# Patient Record
Sex: Male | Born: 1995 | Race: White | Hispanic: Yes | Marital: Single | Smoking: Never smoker
Health system: Southern US, Community
[De-identification: ages and names within clinical notes are randomized; demographics above are authoritative.]

## PROBLEM LIST (undated history)

## (undated) DIAGNOSIS — Z8709 Personal history of other diseases of the respiratory system: Secondary | ICD-10-CM

## (undated) DIAGNOSIS — H0012 Chalazion right lower eyelid: Secondary | ICD-10-CM

## (undated) HISTORY — PX: INGUINAL HERNIA REPAIR: SHX194

---
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
## 2017-04-14 ENCOUNTER — Encounter (HOSPITAL_COMMUNITY): Payer: Self-pay

## 2017-04-14 ENCOUNTER — Emergency Department (HOSPITAL_COMMUNITY): Payer: Medicaid Other

## 2017-04-14 DIAGNOSIS — W228XXA Striking against or struck by other objects, initial encounter: Secondary | ICD-10-CM | POA: Insufficient documentation

## 2017-04-14 DIAGNOSIS — S9002XA Contusion of left ankle, initial encounter: Secondary | ICD-10-CM | POA: Insufficient documentation

## 2017-04-14 DIAGNOSIS — Y939 Activity, unspecified: Secondary | ICD-10-CM | POA: Insufficient documentation

## 2017-04-14 DIAGNOSIS — Y929 Unspecified place or not applicable: Secondary | ICD-10-CM | POA: Diagnosis not present

## 2017-04-14 DIAGNOSIS — S99912A Unspecified injury of left ankle, initial encounter: Secondary | ICD-10-CM | POA: Diagnosis present

## 2017-04-14 DIAGNOSIS — Y990 Civilian activity done for income or pay: Secondary | ICD-10-CM | POA: Insufficient documentation

## 2017-04-14 NOTE — ED Triage Notes (Signed)
Pt states while at work today he hit his L ankle on the corner of something and it still hurts. Pt is ambulatory

## 2017-04-15 ENCOUNTER — Emergency Department (HOSPITAL_COMMUNITY)
Admission: EM | Admit: 2017-04-15 | Discharge: 2017-04-15 | Disposition: A | Discharge status: Home / Self Care | Payer: Medicaid Other | Attending: Emergency Medicine | Admitting: Emergency Medicine

## 2017-04-15 DIAGNOSIS — S9002XA Contusion of left ankle, initial encounter: Secondary | ICD-10-CM

## 2017-04-15 MED ORDER — IBUPROFEN 600 MG PO TABS: 600.0000 mg | ORAL_TABLET | Freq: Four times a day (QID) | ORAL | 0 refills | Status: DC | PRN

## 2017-04-15 NOTE — ED Notes (Signed)
Pt ambulatory to room and in NAD.

## 2017-04-15 NOTE — ED Provider Notes (Signed)
[facility]   CSN: [id number] Arrival date & time: 04/14/17  2138     History   Chief Complaint Chief Complaint  Patient presents with  . Ankle Pain    HPI Derrick Pham is a 21 y.o. male.  21 year old male presents to the emergency department for left ankle pain.  He states that he bumped into something sharp at work and he has been having pain in his left ankle since this time.  He took 2 tablets of Advil prior to arrival without relief.  Pain is aggravated with weightbearing.  He denies any prior ankle injury.  No numbness or tingling.      Past Medical History:  Diagnosis Date  . Chalazion of right lower eyelid 03/2015  . History of asthma    as a child    There are no active problems to display for this patient.   Past Surgical History:  Procedure Laterality Date  . CHALAZION EXCISION Right 04/28/2015   Procedure: EXCISION CHALAZION RIGHT LOWER LID;  Surgeon: French AnaMartha Patel, MD;  Location: [facility];  Service: Ophthalmology;  Laterality: Right;  . INGUINAL HERNIA REPAIR         Home Medications    Prior to Admission medications   Medication Sig Start Date End Date Taking? Authorizing Provider  ibuprofen (ADVIL,MOTRIN) 600 MG tablet Take 1 tablet (600 mg total) by mouth every 6 (six) hours as needed. 04/15/17   Antony MaduraHumes, Kellina Dreese, PA-C  neomycin-polymyxin-dexameth (MAXITROL) 0.1 % OINT Place 1 application into the right eye 3 (three) times daily. For one week 04/28/15   French AnaPatel, Martha, MD    Family History No family history on file.  Social History Social History   Tobacco Use  . Smoking status: Never Smoker  . Smokeless tobacco: Never Used  Substance Use Topics  . Alcohol use: No  . Drug use: No     Allergies   Patient has no known allergies.   Review of Systems Review of Systems Ten systems reviewed and are negative for acute change, except as noted in the HPI.     Physical Exam Updated Vital Signs BP 120/69 (BP Location: Right Arm)   Pulse 95   Temp 100 F (37.8 C) (Oral)   Resp 16   Ht 6' (1.829 m)   Wt 72.1 kg (159 lb)   SpO2 99%   BMI 21.56 kg/m   Physical Exam  Constitutional: He is oriented to person, place, and time. He appears well-developed and well-nourished. No distress.  HENT:  Head: Normocephalic and atraumatic.  Eyes: Conjunctivae and EOM are normal. No scleral icterus.  Neck: Normal range of motion.  Cardiovascular: Normal rate, regular rhythm and intact distal pulses.  DP pulse 2+ in the left lower extremity.  Capillary refill brisk in all digits of left foot  Pulmonary/Chest: Effort normal. No respiratory distress.  Musculoskeletal: Normal range of motion.  Tenderness to palpation to the medial malleolus of the left ankle.  No bony deformity, crepitus.  Minimal swelling.  No effusion, erythema.  Neurological: He is alert and oriented to person, place, and time. He exhibits normal muscle tone. Coordination normal.  Sensation to light touch intact.  Patient able to wiggle all toes.  Skin: Skin is warm and dry. No rash noted. He is not diaphoretic. No erythema. No pallor.  Psychiatric: He has a normal mood and affect. His behavior is normal.  Nursing note and vitals reviewed.    ED  Treatments / Results  Labs (all labs ordered are listed, but only abnormal results are displayed) Labs Reviewed - No data to display  EKG  EKG Interpretation None       Radiology Dg Ankle Complete Left  Result Date: 04/14/2017 CLINICAL DATA:  Hit left ankle on piece of metal EXAM: LEFT ANKLE COMPLETE - 3+ VIEW COMPARISON:  None. FINDINGS: Frontal, oblique, and lateral views were obtained. There is no fracture or joint effusion. No joint space narrowing or erosion. Ankle mortise appears intact. No soft tissue air or radiopaque foreign body. IMPRESSION: No fracture or evident arthropathy.  Ankle mortise appears intact. Electronically  Signed   By: Bretta BangWilliam  Woodruff III M.D.   On: 04/14/2017 22:30    Procedures Procedures (including critical care time)  Medications Ordered in ED Medications - No data to display   Initial Impression / Assessment and Plan / ED Course  I have reviewed the triage vital signs and the nursing notes.  Pertinent labs & imaging results that were available during my care of the patient were reviewed by me and considered in my medical decision making (see chart for details).     Patient presents to the emergency department for evaluation of ankle pain. Patient neurovascularly intact on exam. Imaging negative for fracture, dislocation, bony deformity. Plan for supportive management including RICE and NSAIDs; primary care follow up as needed. Return precautions discussed and provided. Patient discharged in stable condition with no unaddressed concerns.   Final Clinical Impressions(s) / ED Diagnoses   Final diagnoses:  Contusion of left ankle, initial encounter    ED Discharge Orders        Ordered    ibuprofen (ADVIL,MOTRIN) 600 MG tablet  Every 6 hours PRN     04/15/17 0032       Antony MaduraHumes, Kellie Murrill, PA-C 04/15/17 0036    Wilkie AyeHorton, Mayer Maskerourtney F, MD 04/15/17 [phone]

## 2017-04-15 NOTE — ED Notes (Signed)
Pt departed in NAD, refused use of wheelchair.  

## 2019-04-05 IMAGING — CR DG ANKLE COMPLETE 3+V*L*
3 series · 3 of 3 positions shown · non-contrast
Comparison: None.

CLINICAL DATA: Hit left ankle on piece of metal

EXAM:
LEFT ANKLE COMPLETE - 3+ VIEW

[ankle ap]
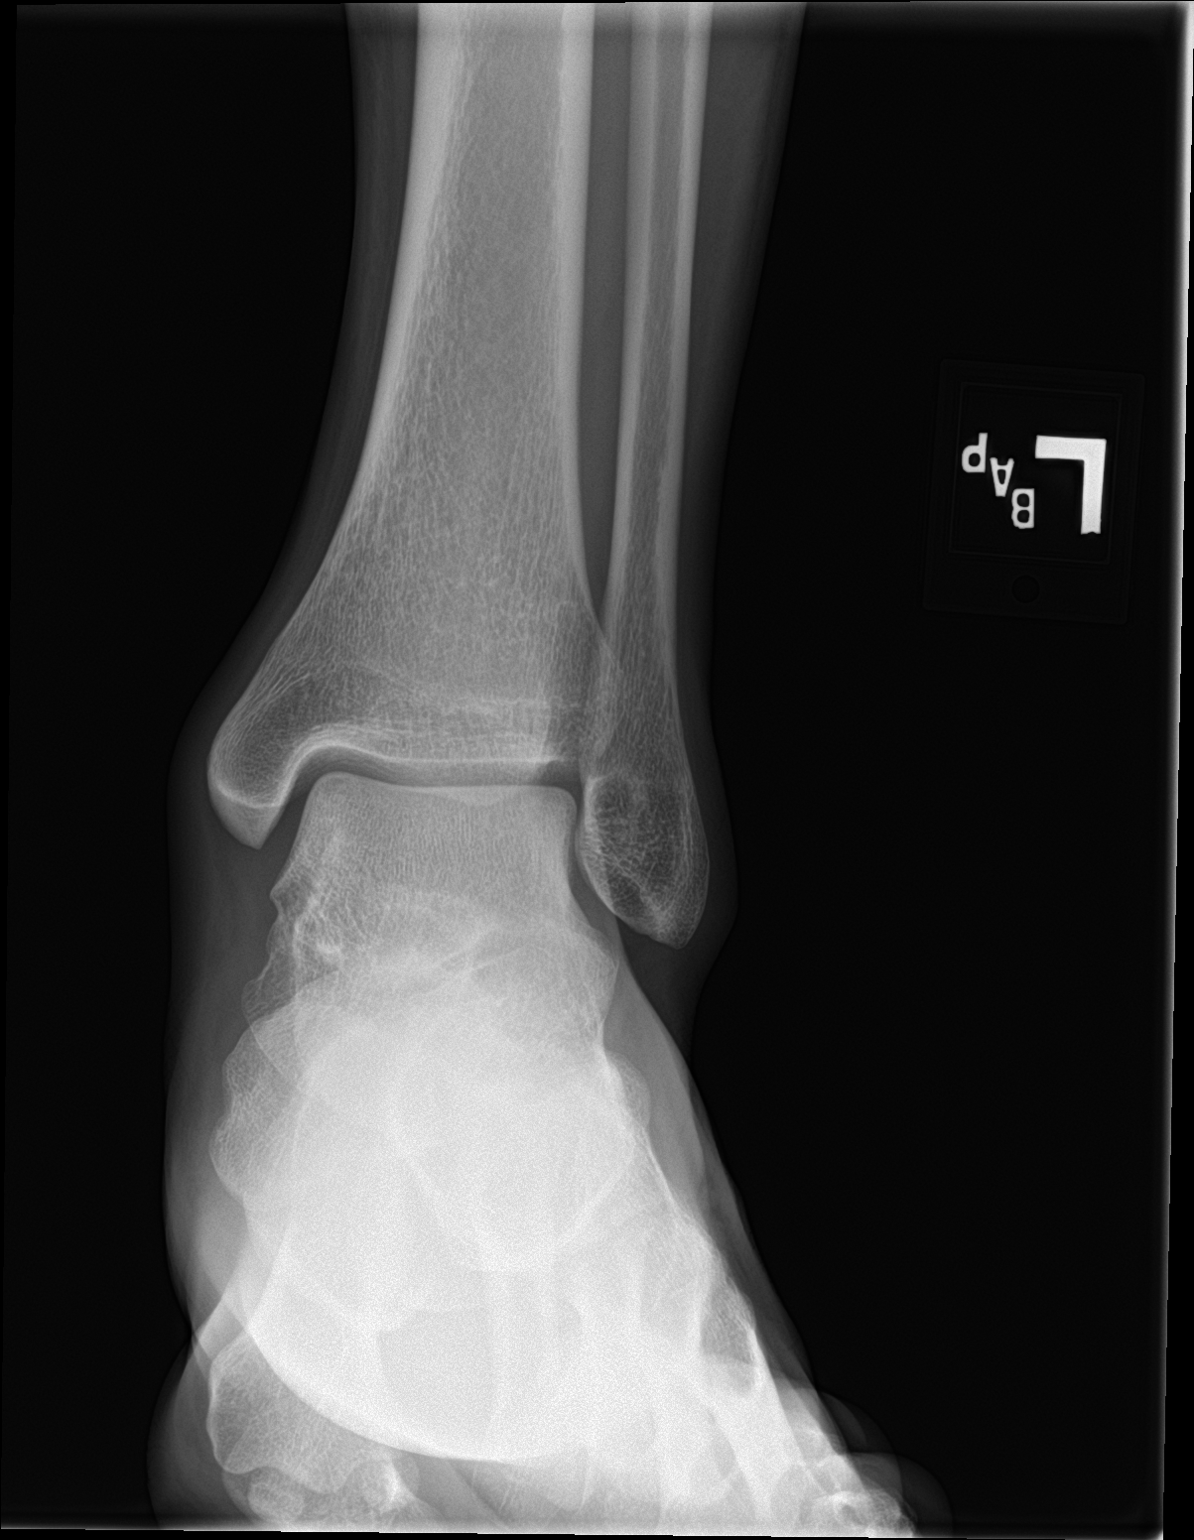

[ankle obl]
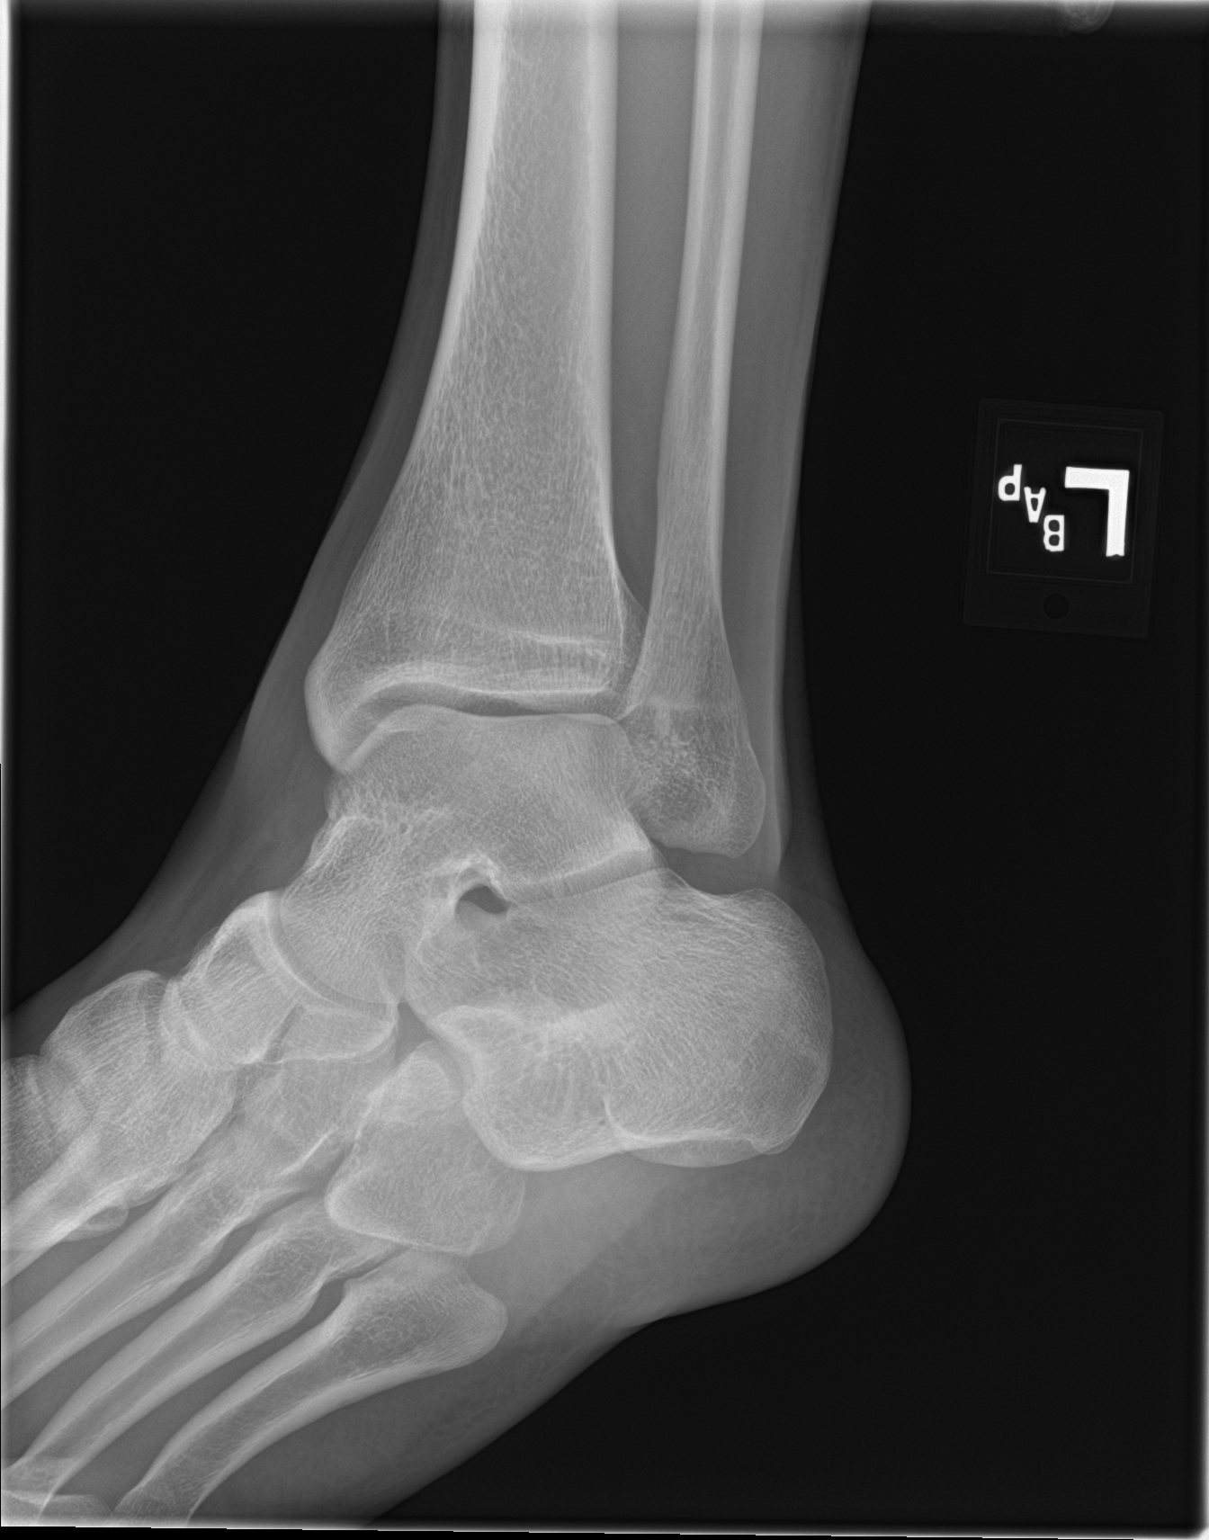

[ankle lat]
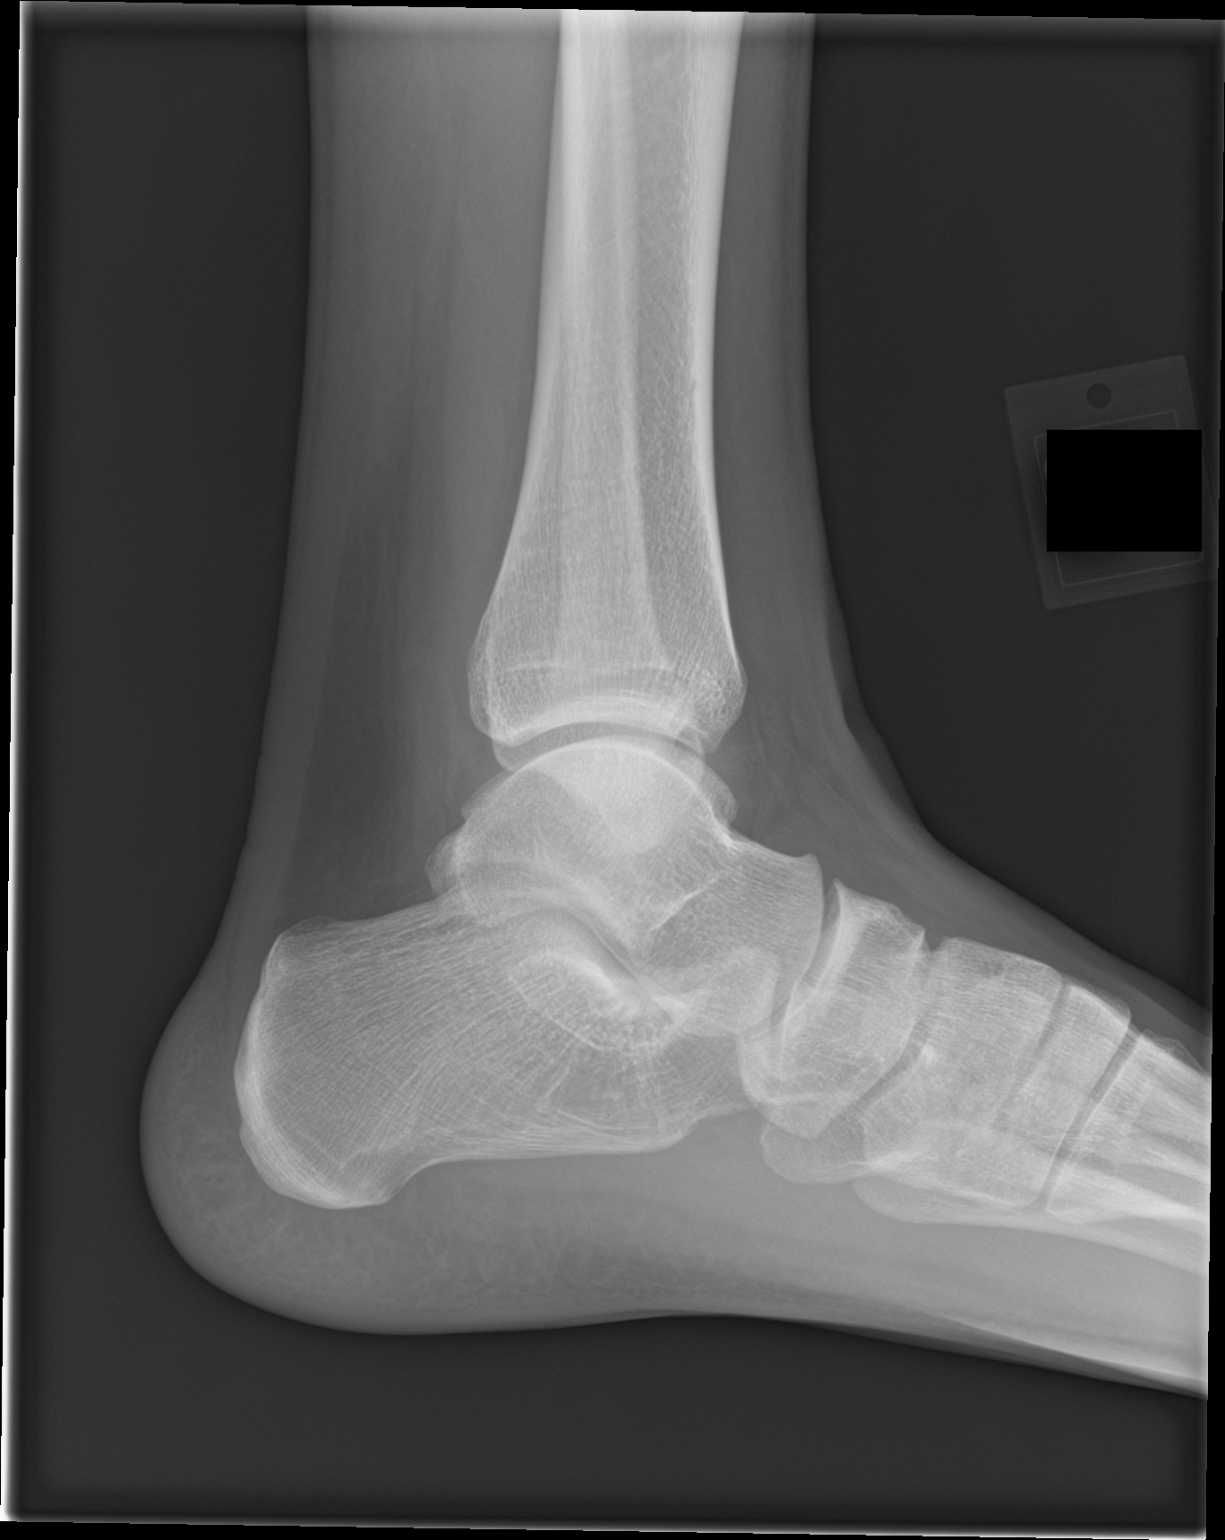

[3 of 3 positions shown; findings below may reference images not displayed]

FINDINGS: Frontal, oblique, and lateral views were obtained. There is no
fracture or joint effusion. No joint space narrowing or erosion.
Ankle mortise appears intact. No soft tissue air or radiopaque
foreign body.
IMPRESSION: No fracture or evident arthropathy.  Ankle mortise appears intact.

## 2023-09-29 ENCOUNTER — Ambulatory Visit (INDEPENDENT_AMBULATORY_CARE_PROVIDER_SITE_OTHER)

## 2023-09-29 ENCOUNTER — Ambulatory Visit: Payer: Self-pay | Admitting: Urgent Care

## 2023-09-29 ENCOUNTER — Ambulatory Visit
Admission: EM | Admit: 2023-09-29 | Discharge: 2023-09-29 | Disposition: A | Discharge status: Home / Self Care | Attending: Family Medicine | Admitting: Family Medicine

## 2023-09-29 ENCOUNTER — Ambulatory Visit
Admission: EM | Admit: 2023-09-29 | Discharge: 2023-09-29 | Disposition: A | Discharge status: Home / Self Care | Attending: Urgent Care | Admitting: Urgent Care

## 2023-09-29 DIAGNOSIS — M25552 Pain in left hip: Secondary | ICD-10-CM | POA: Diagnosis not present

## 2023-09-29 DIAGNOSIS — S5002XA Contusion of left elbow, initial encounter: Secondary | ICD-10-CM | POA: Diagnosis not present

## 2023-09-29 DIAGNOSIS — M25522 Pain in left elbow: Secondary | ICD-10-CM

## 2023-09-29 DIAGNOSIS — S42402A Unspecified fracture of lower end of left humerus, initial encounter for closed fracture: Secondary | ICD-10-CM

## 2023-09-29 DIAGNOSIS — S7002XA Contusion of left hip, initial encounter: Secondary | ICD-10-CM

## 2023-09-29 MED ORDER — NAPROXEN 500 MG PO TABS: 500.0000 mg | ORAL_TABLET | Freq: Two times a day (BID) | ORAL | 0 refills | Status: AC

## 2023-09-29 NOTE — Discharge Instructions (Addendum)
 Your x-ray suggests an occult fracture of the left elbow.  This is a kind of fracture that cannot be seen on x-ray.  He will likely need a CT scan.  For now I have splinted your left elbow.  Please keep the splint on at all times.  Use the sling to help you bear the weight of the splint if you need it.  But otherwise make sure that you continue using your left hand and shoulder.  Still use naproxen for pain and inflammation.  Follow-up with [facility] as soon as possible.

## 2023-09-29 NOTE — ED Triage Notes (Addendum)
 Pt states he fell this am-pain to left hip and left elbow-NAD-steady gait

## 2023-09-29 NOTE — ED Provider Notes (Addendum)
 [facility]  Note:  This document was prepared using Conservation officer, historic buildings and may include unintentional dictation errors.  MRN: [id number] DOB: March 27, 1996  Subjective:   Derrick Pham is a 28 y.o. male presenting for left elbow and left hip pain following an accidental fall this morning.  Patient reports that he was pulling a piece of furniture outside when he slipped and fell onto pavement about 4 feet high.  He observed the impact on his left elbow and left hip/buttock area.  Would like to make sure he does not have a fracture.  No open wounds, drainage of pus or bleeding, bony deformities, head injury, loss conscious, confusion, weakness, numbness or tingling.  Has full range of motion at the left hip but not the left elbow.  Cannot bend or extend fully.  He does note some swelling.  Has not taken medication for relief.  No current facility-administered medications for this encounter.  Current Outpatient Medications:    ibuprofen  (ADVIL ,MOTRIN ) 600 MG tablet, Take 1 tablet (600 mg total) by mouth every 6 (six) hours as needed., Disp: 30 tablet, Rfl: 0   neomycin -polymyxin-dexameth (MAXITROL ) 0.1 % OINT, Place 1 application into the right eye 3 (three) times daily. For one week, Disp: , Rfl:    No Known Allergies  Past Medical History:  Diagnosis Date   Chalazion of right lower eyelid 03/2015   History of asthma    as a child     Past Surgical History:  Procedure Laterality Date   CHALAZION EXCISION Right 04/28/2015   Procedure: EXCISION CHALAZION RIGHT LOWER LID;  Surgeon: Ozella Blush, MD;  Location: [facility];  Service: Ophthalmology;  Laterality: Right;   INGUINAL HERNIA REPAIR      No family history on file.  Social History   Tobacco Use   Smoking status: Never   Smokeless tobacco: Never  Vaping Use   Vaping status: Never Used  Substance Use Topics   Alcohol use: Yes    Comment: weekly   Drug use: No     ROS   Objective:   Vitals: BP (!) 114/59 (BP Location: Right Arm)   Pulse 65   Temp 98.9 F (37.2 C) (Oral)   Resp 16   SpO2 97%   Physical Exam Constitutional:      General: He is not in acute distress.    Appearance: Normal appearance. He is well-developed and normal weight. He is not ill-appearing, toxic-appearing or diaphoretic.  HENT:     Head: Normocephalic and atraumatic.     Right Ear: External ear normal.     Left Ear: External ear normal.     Nose: Nose normal.     Mouth/Throat:     Pharynx: Oropharynx is clear.  Eyes:     General: No scleral icterus.       Right eye: No discharge.        Left eye: No discharge.     Extraocular Movements: Extraocular movements intact.  Cardiovascular:     Rate and Rhythm: Normal rate.  Pulmonary:     Effort: Pulmonary effort is normal.  Musculoskeletal:     Left upper arm: No swelling, edema, deformity, lacerations, tenderness or bony tenderness.     Left elbow: Swelling present. No deformity, effusion or lacerations. Decreased range of motion. Tenderness present in olecranon process. No radial head, medial epicondyle or lateral epicondyle tenderness.     Left forearm: No swelling, edema, deformity, lacerations, tenderness or  bony tenderness.     Cervical back: Normal range of motion.     Left hip: Tenderness (lateral hip) present. No deformity, lacerations, bony tenderness or crepitus. Normal range of motion.  Neurological:     Mental Status: He is alert and oriented to person, place, and time.  Psychiatric:        Mood and Affect: Mood normal.        Behavior: Behavior normal.        Thought Content: Thought content normal.        Judgment: Judgment normal.     Assessment and Plan :   PDMP not reviewed this encounter.  1. Contusion of left elbow, initial encounter   2. Left elbow pain   3. Left hip pain   4. Contusion of left hip, initial encounter    X-ray over-read was pending at time of discharge,  recommended follow up with only abnormal results. Otherwise will not call for negative over-read. Patient was in agreement.  Recommended conservative management for left elbow and hip contusions.  Use icing, naproxen for pain and inflammation. Counseled patient on potential for adverse effects with medications prescribed/recommended today, ER and return-to-clinic precautions discussed, patient verbalized understanding.   Adolph Hoop, [place] 09/29/23 1537   UPDATE: DG Elbow Complete Left Result Date: 09/29/2023 CLINICAL DATA:  left elbow pain EXAM: LEFT ELBOW - COMPLETE 3+ VIEW COMPARISON:  None Available. FINDINGS: No acute, displaced fracture or dislocation. Elevation of the anterior fat pad, suggesting the presence of an underlying joint effusion. There is no evidence of arthropathy or other focal bone abnormality. Soft tissues are unremarkable. IMPRESSION: Elevated anterior fat pad suggests the presence of an elbow joint effusion. While no acute, displaced fracture is visualized, in the setting of trauma, elbow effusions are consistent with occult elbow fracture. Electronically Signed   By: Rance Burrows M.D.   On: 09/29/2023 16:14   DG Hip Unilat With Pelvis 2-3 Views Left Result Date: 09/29/2023 CLINICAL DATA:  Left hip pain EXAM: DG HIP (WITH OR WITHOUT PELVIS) 2-3V LEFT COMPARISON:  None Available. FINDINGS: There is no evidence of hip fracture or dislocation. There is no evidence of arthropathy or other focal bone abnormality. IMPRESSION: Negative. Electronically Signed   By: Tyron Gallon M.D.   On: 09/29/2023 16:06    Patient placed into a long-arm splint with the elbow flexed at 90 degrees.  Provided with a shoulder sling for bearing the weight.  1. Left elbow fracture, closed, initial encounter   2. Left elbow pain   3. Left hip pain   4. Contusion of left elbow, initial encounter   5. Contusion of left hip, initial encounter    Splinted as above, follow-up with [facility].     Adolph Hoop, [place] 09/29/23 1914

## 2023-11-05 NOTE — Unmapped External Note (Signed)
 General Outpatient Physical Therapy Evaluation  Payor: Advertising copywriter / Plan: [employer] PPO EPO CHOICE [employer] / Product Type: PPO /   Visit Count: 1  Demographics:  Age: 28 y.o.  Gender: male  Referring Diagnosis: Closed nondisplaced fracture of head of left radius, initial encounter  Referring Clinician:  Danton Lauraine Hudson, *    Date of Onset: 09/29/2023  History of Present Illness: While at work performing his job duties, patient reported falling off a ladder with injury to his left elbow, left wrist, left lateral hip, left ankle, sternum, and left clavicle.  Patient was initially seen at urgent care then referred to [facility] orthopedic trauma specialist for evaluation and treatment.  Patient reported that his left elbow complaint has been gradually improving but he continues to have difficulty straightening his arm out, lifting and using his left upper extremity with daily activities. Significant Past Medical History:  Medical History[1]   Concurrent Services:  None  Therapy within Past Year:  no   Medications:   Current Outpatient Medications:  .  meloxicam (MOBIC) 15 mg tablet, Take 1 tablet by mouth daily. (Patient not taking: Reported on 10/27/2023) .  naproxen  (NAPROSYN ) 500 mg tablet, Take 500 mg by mouth. Allergies:   Allergies as of 11/05/2023  . (No Known Allergies)    Rehabilitation Precautions/Restrictions:   Precautions/Restrictions Precautions: none         SUBJECTIVE See HPI  Fall Risk Screen:    Current Functional Limitations: Limited tolerance to pushing pulling and lifting with left upper extremity Patient Stated Goals: To be able to perform ADLs, job duties, desired leisure activities without limitation by his left elbow complaint. Pain:  Pain Assessment Pain Assessment: 0-10 Pain Score  : 2 Pain Location: Elbow Pain Orientation: Left Home Environment: Lives with family Work Status: Employed        OBJECTIVE  General Observation:  Forward  head and shoulder slumped sitting posture holding left upper extremity in flexed elbow position  Range of Motion:  Left elbow AROM: Flexion 142 degree Extension -10 degrees from neutral Supination 70 degrees Pronation 80 degrees     Strength:  Left elbow flexion 4/5 MMT Left elbow extension 4/5 MMT Left supination 4/5 MMT Left pronation 4/5 MMT  Grip strength dynamometer position 2 Dominant right hand 75 LBS Left hand 60 LBS    Special Tests:  N/A  Palpation: Tenderness with palpation over left radial head, left triceps insertion  Outcomes:  PT Outcome Measures    Flowsheet Row Value  Quick Dash-Activities   Open a tight or new jar 5  Do heavy household chores (wash walls, wash floors) 4  Carry a shopping bag or briefcase 5  Wash your back 4  Use knife to cut food 4  Recreational activities in which you take some force or impact through your arm, shoulder or hand (golf, hammering, tennis) 4  During the past week, to what extent has your arm, shoulder or hand roblem interfered with your normal social activities with family, friends, neighbors, or groups? 5  During the past week, to what extent were you limited in your work or other regular daily activities as a result of your arm, shoulder, or hand problem 4  Arm, shoulder, or hand pain 4  Tingling (pins and needles) in your arm, shoulder, or hand 3  Duting the past week, how much difficulty have you had sleeping because of the pain in your arm, shoulder or hand? 4  Quick DASH Score 79.55  Interventions: No interventions performed at this time   Education:    Yes, provided as follows:   Barriers to Learning:  No Barriers   Learning Needs:  Treatment plan and Rehabilitation techniques and procedures   Education Provided:  Per learning needs listed above   Audience / Response:  Patient  Applied knowledge   Mode:  Explanation   Interpreter Utilized: N/A      ASSESSMENT Physical findings reveal deficits in  left elbow AROM with patient unable to fully extend his left elbow.  Patient scores a 79.55 with his quick disabilities of the arm shoulder hand index.  Patient is a good candidate for physical therapy intervention and is expected to progress well with treatment program.  Therapy Diagnosis:     ICD-10-CM   1. Closed nondisplaced fracture of head of left radius with routine healing, subsequent encounter  S52.125D   2. Left elbow pain  M25.522    Problem List:     Equipment Needed:  None  Other Rehabilitation Considerations:  None  Response to Evaluation:  The session was tolerated well, as evidenced by expressed motivation towards regaining full mobility in his left elbow.  Rehabilitation Potential:    Motivation/Commitment to Therapy:  Good  Rehabilitation Potential:  Good   Support Structure:  Good  Family member willing to assist  Goals:    Goals Addressed             This Visit's Progress   . PT Goal       Treatment goals to be met by 12/17/2023 Patient to demonstrate independence and report compliance to home exercise program for self-management of complaint. Decrease impairment per quick disabilities of the arm shoulder hand index to 10% impairment or better to improve quality of life Patient to report 80% or better improvement in his ability to use his left upper extremity with performance of ADLs job duties and desired leisure activities with less limitation by his left elbow complaint.        PLAN Treatment Frequency and Duration: PT Frequency: 1x/week; 2x/week PT Duration: 6 weeks Treatment Plan Details: POC end date 12/17/23  Recommended PT Treatment/Interventions: Manual therapy (97140); Therapeutic activity (97530); Therapeutic exercise (97110); Self-care/home management [phone])   Necessity: Required to return to Premorbid environment (or reside in new living environment)?  yes Required to reduce ADL or IADL assistance to Premorbid level?  yes  Recommended  Consults:  None currently  Development of Plan of Care:  Patient participated in plan of care development.  Total Treatment Time (Time & Untimed): Total Treatment minutes: 30 Total Time in Timed Codes:   PT Eval Low Complexity minutes: 30             PT Eval Complexity: History of Comorbidities/Personal Care Impacting Plan of Care: 1-2 personal factors and/or comorbidities Examination of Body Systems: 1-2 elements Presentation of Characteristics: Stable and/or uncomplicated characteristics PT Eval Complexity Score: Low Complexity     The patient has been instructed to contact our clinic if any questions or problems should arise.       [1] No past medical history on file.

## 2023-11-11 NOTE — Progress Notes (Signed)
 Derrick Pham is a 28 y.o.  with  reports that he has quit smoking. His smoking use included cigarettes. He has never used smokeless tobacco. with  Active Ambulatory Problems    Diagnosis Date Noted  . Closed nondisplaced fracture of head of radius with routine healing 11/05/2023  . Left elbow pain 11/05/2023   Resolved Ambulatory Problems    Diagnosis Date Noted  . No Resolved Ambulatory Problems   No Additional Past Medical History   who presents today at Urgent Care for  Chief Complaint  Patient presents with  . Shoulder Injury    L shoulder - Saturday rolled in bed and popped out to the front Radial head fracture- had an xray- fracture occurred June 2nd with a fall off ladder. - now thinks it is displaced having pain radiating down to hand.      HPI History of Present Illness This is a 28 year old male with a history of left radial head fracture presenting with shoulder and elbow pain.  The patient reports that he injured his shoulder when he rolled over in bed on Sunday morning, causing the shoulder to move anteriorly. He is seeking medical attention to have it repositioned. He mentions that he was unable to sleep last night due to the shoulder pain. He also notes a popping sensation in his arm when he raises it, although he still has mobility.  The patient is no longer using a sling or a splint for his elbow, as advised by Dr. Lauraine, an orthopedic trauma specialist. He sustained a radial head fracture in his left elbow on 09/29/2023 after falling from a ladder. He has not yet started physical therapy, with his first appointment scheduled for 11/18/2023. He believes he may have re-injured his elbow while resting both elbows on the kitchen table, describing a sensation of movement in the elbow. He now experiences pain and a prickling sensation in his finger and thumb. He also reports a tingling sensation in his forearm and thenar aspect of his hand. Despite these symptoms, he  maintains good strength. He experiences pain when rotating his forearm, particularly on the radial aspect of his hand.   Patient has no identified co-morbidities or medications that might increase the risk for developing a severe infection     reports that he has quit smoking. His smoking use included cigarettes. He has never used smokeless tobacco.  Review of systems is otherwise negative except as noted in the HPI and Assessment/MDM  Physical Exam BP 105/77 (BP Location: Right arm, Patient Position: Sitting)   Pulse 80   Temp 97.4 F (36.3 C) (Tympanic)   Resp 18   Ht 1.803 m (5' 11)   Wt 74.8 kg (165 lb)   SpO2 100%   BMI 23.01 kg/m   Constitutional:      General: Patient is not in acute distress.    Appearance: Normal appearance.  Neurological:     General: No focal deficit present.     Mental Status: alert and oriented to person, place, and time.  HENT:     Eyes:     Conjunctiva/sclera: Conjunctivae normal; sclera clear.     Pupils: Pupils are equal, round, and reactive to light.     Mucus membranes moist. Cardiovascular:     Heart: Regular rate    No Lower extremity edema noted. Pulmonary:     Effort: Pulmonary effort is normal. No conversational dyspnea. No respiratory distress.     Breath sounds: No audible wheezing Skin:  General: Skin is warm and dry. Intact skin. No rash, erythema or ecchymosis.  Psychiatric:        Mood and Affect: Mood anxious. judgement appears good. Musculoskeletal:        General: No obvious joint or muscle limitation.     Neck:  No rigidity; supple.     Gait: normal, unassisted.   Joint:  Left Elbow and Shoulder with no gross abnormalities, no edema no erythema, no induration no ecchymosis No Tender to palpation over bony prominences of radial head, medial epicondyle or lateral epicondyle, AC joint, scapula humeral head or clavicle.    Extension and Flexion without pain. No apparent limitation.  Forearm Internal and External  rotation with mild pain. No apparent limitation.  Adduction and Abduction without pain. No apparent limitation.             2+ pulse; good capillary refill.  Strength is 5/5 bilaterally  No effusion  Neuro sensation intact; paraesthesias radial aspect left forearm, hand    Neer: negative  Hawkins: negative  Jobe (empty can) Test: negative   Radiologist interpretation:  XR Elbow Minimum 3 Views Left  Final Result by Rankin Ina Slice, MD (07/15 0930)  X-RAY ELBOW LEFT (3+ VIEWS), 11/11/2023 9:20 AM    INDICATION: Pain in left elbow \ M25.522 Pain in left elbow   COMPARISON: None.    IMPRESSION:  1.  Acute nondisplaced radial head intra-articular fracture, best   visualized on the oblique projection   2.  No malalignment.  3.  No joint effusion.  4.  Joint spaces are maintained.    XR Shoulder Minimum 2 Views Left  Final Result by Rankin Ina Slice, MD [phone])  X-RAY SHOULDER LEFT 3 VIEWS, 11/11/2023 9:20 AM    INDICATION: Pain in left shoulder \ M25.512 Pain in left shoulder   COMPARISON: None.    IMPRESSION:  1.  No acute fracture.   2.  No malalignment.  3.  Joint spaces are maintained.       I have personally review radiographs and my interpretation of the radiograph is:   Acute nondisplaced radial head fracture seen. No shoulder dislocation or subluxation; no other acute bony abnormality identified.  X-ray(s) were reviewed and incorporated into the decision making process. No orders of the defined types were placed in this encounter.  No results found for this visit on 11/11/23.   DIAGNOSIS/PLAN  1. Acute pain of left shoulder  XR Shoulder Minimum 2 Views Left    2. Left elbow pain  XR Elbow Minimum 3 Views Left      This is a 28 year old male with a history of left radial head fracture presenting with shoulder and elbow pain.  Injury and Pain occurred as a result of a fall off a ladder as a work place injury on 09/29/2023. He is currently followed  with orthopedics and physical therapy.  Patient presented because he believed he may have inadvertently displaced both the elbow and the shoulder with simple movements.   The patient has no gross abnormalities of either the shoulder or the elbow and demonstrates full joint range of motion.  He has no significant tenderness to palpation of either joint.  He is extremely anxious.  Given the previous elbow injury and patient's symptoms of anxiety, an x-ray of the left shoulder and left elbow was performed in clinic today.  Personal and radiologic interpretation of the x-ray shows an acute nondisplaced radial head fracture left elbow.  The original x-ray  on 09/29/2023 demonstrated an occult fracture of the radial head.  Today's left shoulder x-ray demonstrates no acute fracture, dislocation or subluxation. Patient is currently in physical therapy for his left  elbow injury.    The left elbow splint, which was originally placed on the day of injury, was subsequently removed by orthopedic direction on 09/30/2023 per patient.  He is not using the recommended splint.  For home care: - I have reviewed the concepts of RICE and encouranged: Rest of the  extremity; application of Ice for pain and swelling: Compression;  and Elevation of the extremity to prevent/minimize swelling.   - I provided handout(s) regarding exercises that are hoped to reduce daily pain levels, improve function and reduce necessity for oral medication.  Otherwise, they are advised to use Ibuprofen  or Tylenol for pain relief, if not allergic; use ice for 15-20 minutes (but no longer) for pain relief prn.  - Use topical anesthetics, including diclofenac, capsaicin, IcyHot, etc.. - Continue activity as tolerated. - Follow up with orthopedics and physical therapy for further evaluation and management as scheduled.  Results discussed with patient. Discussed symptoms most likely sprain/ contusion. Removable thumb spica placed. Supportive care  discussed. Recommend gentle ROM as tolerated. Strict return precautions discussed, including worsening symptoms or no improvement in one week. Patient agreeable without further questions or concerns.   DDX: acute fracture, subluxation/dislocation, joint effusion, septic joint, gout, inflammatory arthritis  Anticipated course of illness reviewed. Symptomatic management discussed.  Appropriate and anticipated follow up reviewed with patient. Patient was given verbal and written instructions on symptoms that necessitate return to the UC/ED, and instructed to follow up with Urgent Care or their Primary Care Provider if they are not improving in the expected timeframe. Urgent Care Disposition:  Home Care  Patient education (verbal/handout) given on diagnosis, pathophysiology, treatment of diagnosis, medications & side effects, exercises, the follow-up plan and supportives measures.  We discussed risks and side effects of medications, and also discussed red flags which would warrant immediate follow-up.    Patient and/or parent/guardian (if applicable) agreed with plan and voiced understanding.  No barriers to adherence perceived by myself.  Electronically signed by Sonny Caldron Arledge  Tue 11/11/2023 9:40 AM

## 2023-11-12 ENCOUNTER — Ambulatory Visit: Admission: EM | Admit: 2023-11-12 | Discharge: 2023-11-12 | Disposition: A | Discharge status: Home / Self Care

## 2023-11-12 DIAGNOSIS — M545 Low back pain, unspecified: Secondary | ICD-10-CM

## 2023-11-12 NOTE — Discharge Instructions (Addendum)
 You were evaluated today for low back pain. Earlier today, you were seen at [facility] where X-rays of your lumbar and thoracic spine were obtained and reviewed by an orthopedic specialist. The imaging was normal with no evidence of fracture or other acute issues. You are scheduled to begin physical therapy tomorrow as recommended. At this time, no further imaging or treatment is needed. Continue with physical therapy as planned and follow up with [facility] if you have questions or if your symptoms do not improve. You may manage discomfort at home with rest, ice or heat, over-the-counter anti-inflammatory medications like ibuprofen , and avoiding activities that worsen the pain. Seek emergency care if you develop new or worsening numbness, weakness in your legs, loss of bladder or bowel control, or severe, unrelenting pain. Contact your primary care provider for ongoing concerns or if symptoms persist despite physical therapy.

## 2023-11-12 NOTE — ED Provider Notes (Signed)
 [facility]    CSN: [id number] Arrival date & time: 11/12/23  1137      History   Chief Complaint Chief Complaint  Patient presents with   Back Pain    HPI Derrick Pham is a 28 y.o. male.   Discussed the use of AI scribe software for clinical note transcription with the patient, who gave verbal consent to proceed.   The patient presents with back pain that began two days ago following a chiropractic adjustment. He reports that during the adjustment, he bent forward and felt his spine come out and go to the side. He remained in this position for a couple of days before self-adjusting in bed, which he states put his spine back in place. However, upon standing up, he felt a popping sensation. The pain has been worsening since then. He denies any numbness or tingling of the lower extremities. No weakness or loss of sensation. No bowel or bladder incontinence. No gait issues.   The patient visited [facility] earlier today for evaluation of this back pain. He underwent an X-rays and was told that everything was fine. He was ordered physical therapy in which he is start tomorrow. The patient expresses concern that the X-rays may not have captured the area where he feels a bulge, although he acknowledges seeing X-ray images that included his pelvis and lower back. Despite being told by the specialist that he should be fine, the patient remains worried about his condition, noting that he feels there is an underlying problem and that his back is starting to curve below the area of concern.  The following portions of the patient's history were reviewed and updated as appropriate: allergies, current medications, past family history, past medical history, past social history, past surgical history, and problem list.     Past Medical History:  Diagnosis Date   Chalazion of right lower eyelid 03/2015   History of asthma    as a child    There are no active problems to  display for this patient.   Past Surgical History:  Procedure Laterality Date   CHALAZION EXCISION Right 04/28/2015   Procedure: EXCISION CHALAZION RIGHT LOWER LID;  Surgeon: Glendale Blanch, MD;  Location: [facility];  Service: Ophthalmology;  Laterality: Right;   INGUINAL HERNIA REPAIR         Home Medications    Prior to Admission medications   Medication Sig Start Date End Date Taking? Authorizing Provider  ibuprofen  (ADVIL ,MOTRIN ) 600 MG tablet Take 1 tablet (600 mg total) by mouth every 6 (six) hours as needed. 04/15/17   Keith Sor, PA-C  naproxen  (NAPROSYN ) 500 MG tablet Take 1 tablet (500 mg total) by mouth 2 (two) times daily with a meal. 09/29/23   Christopher Savannah, PA-C  neomycin -polymyxin-dexameth (MAXITROL ) 0.1 % OINT Place 1 application into the right eye 3 (three) times daily. For one week 04/28/15   Blanch Glendale, MD    Family History History reviewed. No pertinent family history.  Social History Social History   Tobacco Use   Smoking status: Never   Smokeless tobacco: Never  Vaping Use   Vaping status: Never Used  Substance Use Topics   Alcohol use: Yes    Comment: weekly   Drug use: No     Allergies   Patient has no known allergies.   Review of Systems Review of Systems  Musculoskeletal:  Positive for back pain. Negative for gait problem.  Neurological:  Negative for weakness and  numbness.  All other systems reviewed and are negative.    Physical Exam Triage Vital Signs ED Triage Vitals  Encounter Vitals Group     BP      Girls Systolic BP Percentile      Girls Diastolic BP Percentile      Boys Systolic BP Percentile      Boys Diastolic BP Percentile      Pulse      Resp      Temp      Temp src      SpO2      Weight      Height      Head Circumference      Peak Flow      Pain Score      Pain Loc      Pain Education      Exclude from Growth Chart    No data found.  Updated Vital Signs BP 120/70 (BP Location: Right  Arm)   Pulse (!) 58   Resp 17   SpO2 98%   Visual Acuity Right Eye Distance:   Left Eye Distance:   Bilateral Distance:    Right Eye Near:   Left Eye Near:    Bilateral Near:     Physical Exam Vitals reviewed.  Constitutional:      General: He is not in acute distress.    Appearance: Normal appearance. He is not ill-appearing, toxic-appearing or diaphoretic.  HENT:     Head: Normocephalic.     Mouth/Throat:     Mouth: Mucous membranes are moist.  Cardiovascular:     Rate and Rhythm: Normal rate and regular rhythm.  Pulmonary:     Effort: Pulmonary effort is normal.     Breath sounds: Normal breath sounds.  Abdominal:     Palpations: Abdomen is soft.     Tenderness: There is no right CVA tenderness or left CVA tenderness.  Musculoskeletal:        General: Normal range of motion.     Cervical back: Normal, normal range of motion and neck supple.     Thoracic back: Normal.     Lumbar back: Tenderness present. No swelling, deformity, lacerations or spasms. Normal range of motion. Negative right straight leg raise test and negative left straight leg raise test.  Skin:    General: Skin is warm and dry.  Neurological:     General: No focal deficit present.     Mental Status: He is alert and oriented to person, place, and time.     Cranial Nerves: Cranial nerves 2-12 are intact.     Sensory: Sensation is intact.     Motor: Motor function is intact. No weakness.     Coordination: Coordination is intact.     Gait: Gait is intact.  Psychiatric:        Mood and Affect: Mood normal.        Speech: Speech normal.        Behavior: Behavior normal. Behavior is cooperative.      UC Treatments / Results  Labs (all labs ordered are listed, but only abnormal results are displayed) Labs Reviewed - No data to display  EKG   Radiology No results found.  Procedures Procedures (including critical care time)  Medications Ordered in UC Medications - No data to  display  Initial Impression / Assessment and Plan / UC Course  I have reviewed the triage vital signs and the nursing notes.  Pertinent labs & imaging  results that were available during my care of the patient were reviewed by me and considered in my medical decision making (see chart for details).    Patient presents with back pain that began after a chiropractic adjustment two days ago. He was evaluated earlier today at [facility], where X-rays of the lumbar and thoracic spine were obtained and reviewed by an orthopedic specialist. The imaging was confirmed to be appropriate and showed no acute abnormalities. The specialist recommended initiating physical therapy, and the patient is scheduled to begin treatment tomorrow. The patient expressed concern that the area of pain may not have been adequately imaged, but it was confirmed that both lumbar and thoracic regions were included. At this time, no further imaging or intervention is indicated. Patient was advised to proceed with scheduled physical therapy and follow up with [facility] for any persistent symptoms or additional concerns. Seek emergency care for worsening pain, numbness, weakness, or loss of bowel or bladder control.  Today's evaluation has revealed no signs of a dangerous process. Discussed diagnosis with patient and/or guardian. Patient and/or guardian aware of their diagnosis, possible red flag symptoms to watch out for and need for close follow up. Patient and/or guardian understands verbal and written discharge instructions. Patient and/or guardian comfortable with plan and disposition.  Patient and/or guardian has a clear mental status at this time, good insight into illness (after discussion and teaching) and has clear judgment to make decisions regarding their care  Documentation was completed with the aid of voice recognition software. Transcription may contain typographical errors. Final Clinical Impressions(s) / UC Diagnoses    Final diagnoses:  Acute right-sided low back pain without sciatica     Discharge Instructions      You were evaluated today for low back pain. Earlier today, you were seen at [facility] where X-rays of your lumbar and thoracic spine were obtained and reviewed by an orthopedic specialist. The imaging was normal with no evidence of fracture or other acute issues. You are scheduled to begin physical therapy tomorrow as recommended. At this time, no further imaging or treatment is needed. Continue with physical therapy as planned and follow up with [facility] if you have questions or if your symptoms do not improve. You may manage discomfort at home with rest, ice or heat, over-the-counter anti-inflammatory medications like ibuprofen , and avoiding activities that worsen the pain. Seek emergency care if you develop new or worsening numbness, weakness in your legs, loss of bladder or bowel control, or severe, unrelenting pain. Contact your primary care provider for ongoing concerns or if symptoms persist despite physical therapy.      ED Prescriptions   None    PDMP not reviewed this encounter.   Iola Lukes, [place] 11/12/23 1247

## 2023-11-12 NOTE — ED Triage Notes (Signed)
 Pt present for back pain. States he wants an xray.

## 2023-11-28 ENCOUNTER — Ambulatory Visit: Admission: EM | Admit: 2023-11-28 | Discharge: 2023-11-28 | Disposition: A | Discharge status: Home / Self Care

## 2023-11-28 ENCOUNTER — Encounter: Payer: Self-pay | Admitting: Emergency Medicine

## 2023-11-28 DIAGNOSIS — Z711 Person with feared health complaint in whom no diagnosis is made: Secondary | ICD-10-CM | POA: Diagnosis not present

## 2023-11-28 NOTE — ED Triage Notes (Addendum)
 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 He has had multiple visits for back pain, left arm pain, collar bone pain, and left leg pain since fall.

## 2023-11-28 NOTE — ED Provider Notes (Addendum)
 [facility]    CSN: [id number] Arrival date & time: 11/28/23  1036      History   Chief Complaint Chief Complaint  Patient presents with   Back Pain    HPI Derrick Pham is a 28 y.o. male.   Derrick Pham is a 28 y.o. male presenting for chief complaint of Back Pain as a result of fall that happened September 29, 2023. He fell backwards off of a ladder and fell 6-7 feet. He landed onto his left side onto concrete. He broke his left elbow and left collarbone. He has had persistent back pain ever since the fall.  He has had multiple follow-up appointments with [facility].  He is here today stating that his thoracic spine feels like a bone popped out.  He states he was able to pop it back in.  He is requesting pain medication today, however he denies tenderness currently to the thoracic spine.  No new falls/trauma/injuries, numbness or tingling, saddle paresthesia, changes to bowel or urinary habits, extremity weakness, radicular symptoms.  He is not taking any over-the-counter medications to help with symptoms PTA.   Back Pain   Past Medical History:  Diagnosis Date   Chalazion of right lower eyelid 03/2015   History of asthma    as a child    There are no active problems to display for this patient.   Past Surgical History:  Procedure Laterality Date   CHALAZION EXCISION Right 04/28/2015   Procedure: EXCISION CHALAZION RIGHT LOWER LID;  Surgeon: Glendale Blanch, MD;  Location: [facility];  Service: Ophthalmology;  Laterality: Right;   INGUINAL HERNIA REPAIR         Home Medications    Prior to Admission medications   Medication Sig Start Date End Date Taking? Authorizing Provider  ibuprofen  (ADVIL ,MOTRIN ) 600 MG tablet Take 1 tablet (600 mg total) by mouth every 6 (six) hours as needed. 04/15/17   Keith Sor, PA-C  naproxen  (NAPROSYN ) 500 MG tablet Take 1 tablet (500 mg total) by mouth 2 (two) times daily with a meal. 09/29/23   Christopher Savannah, PA-C  neomycin -polymyxin-dexameth (MAXITROL ) 0.1 % OINT Place 1 application into the right eye 3 (three) times daily. For one week 04/28/15   Blanch Glendale, MD    Family History History reviewed. No pertinent family history.  Social History Social History   Tobacco Use   Smoking status: Never   Smokeless tobacco: Never  Vaping Use   Vaping status: Never Used  Substance Use Topics   Alcohol use: Yes    Comment: weekly   Drug use: No     Allergies   Patient has no known allergies.   Review of Systems Review of Systems  Musculoskeletal:  Positive for back pain.  Per HPI   Physical Exam Triage Vital Signs ED Triage Vitals  Encounter Vitals Group     BP 11/28/23 1107 117/69     Girls Systolic BP Percentile --      Girls Diastolic BP Percentile --      Boys Systolic BP Percentile --      Boys Diastolic BP Percentile --      Pulse Rate 11/28/23 1107 75     Resp 11/28/23 1107 20     Temp 11/28/23 1107 98.3 F (36.8 C)     Temp Source 11/28/23 1107 Oral     SpO2 11/28/23 1107 96 %     Weight --      Height --  Head Circumference --      Peak Flow --      Pain Score 11/28/23 1110 3     Pain Loc --      Pain Education --      Exclude from Growth Chart --    No data found.  Updated Vital Signs BP 117/69 (BP Location: Right Arm)   Pulse 75   Temp 98.3 F (36.8 C) (Oral)   Resp 20   SpO2 96%   Visual Acuity Right Eye Distance:   Left Eye Distance:   Bilateral Distance:    Right Eye Near:   Left Eye Near:    Bilateral Near:     Physical Exam Vitals and nursing note reviewed.  Constitutional:      Appearance: He is not ill-appearing or toxic-appearing.  HENT:     Head: Normocephalic and atraumatic.     Right Ear: Hearing and external ear normal.     Left Ear: Hearing and external ear normal.     Nose: Nose normal.     Mouth/Throat:     Lips: Pink.  Eyes:     General: Lids are normal. Vision grossly intact. Gaze aligned appropriately.      Extraocular Movements: Extraocular movements intact.     Conjunctiva/sclera: Conjunctivae normal.  Cardiovascular:     Rate and Rhythm: Normal rate and regular rhythm.     Heart sounds: Normal heart sounds, S1 normal and S2 normal.  Pulmonary:     Effort: Pulmonary effort is normal. No respiratory distress.     Breath sounds: Normal breath sounds and air entry.  Musculoskeletal:     Cervical back: Normal and neck supple.     Thoracic back: Normal. No swelling, edema, deformity, signs of trauma, lacerations, spasms, tenderness or bony tenderness. Normal range of motion. No scoliosis.     Lumbar back: Normal.  Skin:    General: Skin is warm and dry.     Capillary Refill: Capillary refill takes less than 2 seconds.     Findings: No rash.  Neurological:     General: No focal deficit present.     Mental Status: He is alert and oriented to person, place, and time. Mental status is at baseline.     GCS: GCS eye subscore is 4. GCS verbal subscore is 5. GCS motor subscore is 6.     Cranial Nerves: Cranial nerves 2-12 are intact. No dysarthria or facial asymmetry.     Sensory: Sensation is intact.     Motor: Motor function is intact. No weakness, tremor, abnormal muscle tone or pronator drift.     Coordination: Coordination is intact. Romberg sign negative. Coordination normal. Finger-Nose-Finger Test normal.     Gait: Gait is intact.     Comments: Strength and sensation intact to bilateral upper and lower extremities (5/5). Moves all 4 extremities with normal coordination voluntarily. Non-focal neuro exam.   Psychiatric:        Attention and Perception: Attention normal.        Mood and Affect: Mood is anxious.        Speech: Speech is rapid and pressured.        Behavior: Behavior is agitated.        Thought Content: Thought content normal.        Judgment: Judgment normal.      UC Treatments / Results  Labs (all labs ordered are listed, but only abnormal results are  displayed) Labs Reviewed - No data to  display  EKG   Radiology No results found.  Procedures Procedures (including critical care time)  Medications Ordered in UC Medications - No data to display  Initial Impression / Assessment and Plan / UC Course  I have reviewed the triage vital signs and the nursing notes.  Pertinent labs & imaging results that were available during my care of the patient were reviewed by me and considered in my medical decision making (see chart for details).   1.  Physically well but worried No abnormalities on exam. Patient has had multiple examinations and imaging studies of the spine since his fall. He is currently receiving physical therapy for his injuries secondary to fall on September 29, 2023. He had x-rays of the lumbar and thoracic spine at [facility] both of which were deemed unremarkable. On palpation on exam today, he is nontender and without bony abnormality. Lungs are clear, no signs of repeat injury, therefore deferred imaging. Neurovascularly intact to distal bilateral upper and lower extremities.  Encouraged him to follow-up with [facility] for ongoing complaints and concerns related to chronic pain as a result of accident.   Counseled patient on potential for adverse effects with medications prescribed/recommended today, strict ER and return-to-clinic precautions discussed, patient verbalized understanding.    Final Clinical Impressions(s) / UC Diagnoses   Final diagnoses:  Physically well but worried   Discharge Instructions   None    ED Prescriptions   None    PDMP not reviewed this encounter.   Enedelia Dorna HERO, FNP 11/28/23 1210    Enedelia Dorna HERO, FNP 11/28/23 [phone]

## 2023-12-03 ENCOUNTER — Ambulatory Visit (INDEPENDENT_AMBULATORY_CARE_PROVIDER_SITE_OTHER)
Admission: EM | Admit: 2023-12-03 | Discharge: 2023-12-03 | Disposition: A | Discharge status: Other Acute Inpatient Hospital

## 2023-12-03 DIAGNOSIS — R079 Chest pain, unspecified: Secondary | ICD-10-CM | POA: Insufficient documentation

## 2023-12-03 DIAGNOSIS — F323 Major depressive disorder, single episode, severe with psychotic features: Secondary | ICD-10-CM

## 2023-12-03 DIAGNOSIS — R45851 Suicidal ideations: Secondary | ICD-10-CM

## 2023-12-03 DIAGNOSIS — M542 Cervicalgia: Secondary | ICD-10-CM | POA: Insufficient documentation

## 2023-12-03 DIAGNOSIS — F22 Delusional disorders: Secondary | ICD-10-CM

## 2023-12-03 DIAGNOSIS — Z008 Encounter for other general examination: Secondary | ICD-10-CM | POA: Insufficient documentation

## 2023-12-03 LAB — POCT URINE DRUG SCREEN - MANUAL ENTRY (I-SCREEN)
POC Amphetamine UR: NOT DETECTED
POC Buprenorphine (BUP): NOT DETECTED
POC Cocaine UR: NOT DETECTED
POC Marijuana UR: NOT DETECTED
POC Methadone UR: NOT DETECTED
POC Methamphetamine UR: NOT DETECTED
POC Morphine: NOT DETECTED
POC Oxazepam (BZO): NOT DETECTED
POC Oxycodone UR: NOT DETECTED
POC Secobarbital (BAR): NOT DETECTED

## 2023-12-03 LAB — COMPREHENSIVE METABOLIC PANEL WITH GFR
ALT: 13 U/L (ref 0–44)
AST: 15 U/L (ref 15–41)
Albumin: 4.7 g/dL (ref 3.5–5.0)
Alkaline Phosphatase: 53 U/L (ref 38–126)
Anion gap: 10 (ref 5–15)
BUN: 13 mg/dL (ref 6–20)
CO2: 26 mmol/L (ref 22–32)
Calcium: 9.5 mg/dL (ref 8.9–10.3)
Chloride: 103 mmol/L (ref 98–111)
Creatinine, Ser: 0.8 mg/dL (ref 0.61–1.24)
GFR, Estimated: >60 mL/min (ref >60)
Glucose, Bld: 98 mg/dL (ref 70–99)
Potassium: 4.1 mmol/L (ref 3.5–5.1)
Sodium: 139 mmol/L (ref 135–145)
Total Bilirubin: 0.7 mg/dL (ref 0.0–1.2)
Total Protein: 7.5 g/dL (ref 6.5–8.1)

## 2023-12-03 LAB — TSH: TSH: 1.298 u[IU]/mL (ref 0.350–4.500)

## 2023-12-03 LAB — CBC WITH DIFFERENTIAL/PLATELET
Abs Immature Granulocytes: 0.05 K/uL (ref 0.00–0.07)
Basophils Absolute: 0.1 K/uL (ref 0.0–0.1)
Basophils Relative: 1 %
Eosinophils Absolute: 0 K/uL (ref 0.0–0.5)
Eosinophils Relative: 0 %
HCT: 43.8 % (ref 39.0–52.0)
Hemoglobin: 14.6 g/dL (ref 13.0–17.0)
Immature Granulocytes: 1 %
Lymphocytes Relative: 14 %
Lymphs Abs: 1.3 K/uL (ref 0.7–4.0)
MCH: 29.3 pg (ref 26.0–34.0)
MCHC: 33.3 g/dL (ref 30.0–36.0)
MCV: 87.8 fL (ref 80.0–100.0)
Monocytes Absolute: 0.7 K/uL (ref 0.1–1.0)
Monocytes Relative: 8 %
Neutro Abs: 7.3 K/uL (ref 1.7–7.7)
Neutrophils Relative %: 76 %
Platelets: 282 K/uL (ref 150–400)
RBC: 4.99 MIL/uL (ref 4.22–5.81)
RDW: 12.3 % (ref 11.5–15.5)
WBC: 9.5 K/uL (ref 4.0–10.5)
nRBC: 0 % (ref 0.0–0.2)

## 2023-12-03 LAB — LIPID PANEL
Cholesterol: 159 mg/dL (ref 0–200)
HDL: 46 mg/dL (ref >40)
LDL Cholesterol: 65 mg/dL (ref 0–99)
Total CHOL/HDL Ratio: 3.5 ratio
Triglycerides: 239 mg/dL — ABNORMAL HIGH (ref <150)
VLDL: 48 mg/dL — ABNORMAL HIGH (ref 0–40)

## 2023-12-03 LAB — MAGNESIUM: Magnesium: 2.2 mg/dL (ref 1.7–2.4)

## 2023-12-03 LAB — ETHANOL: Alcohol, Ethyl (B): <15 mg/dL (ref <15)

## 2023-12-03 MED ORDER — HALOPERIDOL 5 MG PO TABS: 5.0000 mg | ORAL_TABLET | Freq: Three times a day (TID) | ORAL | Status: DC | PRN

## 2023-12-03 MED ORDER — LORAZEPAM 2 MG/ML IJ SOLN: 2.0000 mg | Freq: Three times a day (TID) | INTRAMUSCULAR | Status: DC | PRN

## 2023-12-03 MED ORDER — DIPHENHYDRAMINE HCL 50 MG PO CAPS: 50.0000 mg | ORAL_CAPSULE | Freq: Three times a day (TID) | ORAL | Status: DC | PRN

## 2023-12-03 MED ORDER — HALOPERIDOL 5 MG PO TABS
5.0000 mg | ORAL_TABLET | Freq: Three times a day (TID) | ORAL | Status: DC | PRN
Start: 2023-12-03 — End: 2023-12-03

## 2023-12-03 MED ORDER — HALOPERIDOL LACTATE 5 MG/ML IJ SOLN: 10.0000 mg | Freq: Three times a day (TID) | INTRAMUSCULAR | Status: DC | PRN

## 2023-12-03 MED ORDER — MAGNESIUM HYDROXIDE 400 MG/5ML PO SUSP: 30.0000 mL | Freq: Every day | ORAL | Status: DC | PRN

## 2023-12-03 MED ORDER — ALUM & MAG HYDROXIDE-SIMETH 200-200-20 MG/5ML PO SUSP
30.0000 mL | ORAL | Status: DC | PRN
Start: 2023-12-03 — End: 2023-12-04

## 2023-12-03 MED ORDER — ACETAMINOPHEN 325 MG PO TABS: 650.0000 mg | ORAL_TABLET | Freq: Four times a day (QID) | ORAL | Status: DC | PRN

## 2023-12-03 MED ORDER — DIPHENHYDRAMINE HCL 50 MG/ML IJ SOLN: 50.0000 mg | Freq: Three times a day (TID) | INTRAMUSCULAR | Status: DC | PRN

## 2023-12-03 MED ORDER — HALOPERIDOL LACTATE 5 MG/ML IJ SOLN: 5.0000 mg | Freq: Three times a day (TID) | INTRAMUSCULAR | Status: DC | PRN

## 2023-12-03 MED ORDER — TRAZODONE HCL 50 MG PO TABS: 50.0000 mg | ORAL_TABLET | Freq: Every evening | ORAL | Status: DC | PRN

## 2023-12-03 MED ORDER — HYDROXYZINE HCL 25 MG PO TABS: 25.0000 mg | ORAL_TABLET | Freq: Three times a day (TID) | ORAL | Status: DC | PRN

## 2023-12-03 MED ORDER — OLANZAPINE 5 MG PO TBDP
5.0000 mg | ORAL_TABLET | Freq: Two times a day (BID) | ORAL | Status: DC
  Filled 2023-12-03: qty 1
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
## 2023-12-03 NOTE — ED Notes (Signed)
 Pt is seen using unit telephone and interacting appropriately on the unit. He denies SI/HI/AVH. He stated that he fell off of a ladder on June 2nd and has a fracture to his Left ankle. He also c/o back pain due to that fall. He denied needing pain medication for his symptoms. He accepted meal and beverage. He is safe on the unit at this time with continuous observation in place.

## 2023-12-03 NOTE — Progress Notes (Signed)
 Pt has been accepted to [facility] on 12/03/2023. Bed assignment:302-1.   Pt meets inpatient criteria per Suzen Lesches, NP  Attending Physician will be Dr. Zouev  Report can be called to: Adult unit: [phone]  Pt can arrive pending labs  Care Team Notified: [facility] Cherylynn Ernst, RN, Suzen Lesches, NP, Lajuana Nett, RN, Beulah Guan, LPN

## 2023-12-03 NOTE — Discharge Instructions (Addendum)
Accepted to [facility]

## 2023-12-03 NOTE — ED Notes (Signed)
 Pt accepted to [facility] 302-1. Report called to Amia, RN.
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
## 2023-12-04 ENCOUNTER — Inpatient Hospital Stay (HOSPITAL_COMMUNITY)
Admission: AD | Admit: 2023-12-04 | Discharge: 2023-12-05 | DRG: 882 | Disposition: A | Discharge status: Home / Self Care | Source: Intra-hospital

## 2023-12-04 ENCOUNTER — Other Ambulatory Visit: Payer: Self-pay

## 2023-12-04 ENCOUNTER — Encounter (HOSPITAL_COMMUNITY): Payer: Self-pay | Admitting: Family Medicine

## 2023-12-04 DIAGNOSIS — R45851 Suicidal ideations: Secondary | ICD-10-CM | POA: Diagnosis present

## 2023-12-04 DIAGNOSIS — F902 Attention-deficit hyperactivity disorder, combined type: Secondary | ICD-10-CM | POA: Diagnosis present

## 2023-12-04 DIAGNOSIS — F4323 Adjustment disorder with mixed anxiety and depressed mood: Principal | ICD-10-CM | POA: Diagnosis present

## 2023-12-04 DIAGNOSIS — F323 Major depressive disorder, single episode, severe with psychotic features: Secondary | ICD-10-CM | POA: Diagnosis present

## 2023-12-04 DIAGNOSIS — R45850 Homicidal ideations: Secondary | ICD-10-CM | POA: Diagnosis present

## 2023-12-04 LAB — HEMOGLOBIN A1C
Hgb A1c MFr Bld: 5.4 % (ref 4.8–5.6)
Mean Plasma Glucose: 108 mg/dL

## 2023-12-04 MED ORDER — LORAZEPAM 2 MG/ML IJ SOLN: 2.0000 mg | Freq: Three times a day (TID) | INTRAMUSCULAR | Status: DC | PRN

## 2023-12-04 MED ORDER — OLANZAPINE 5 MG PO TBDP
5.0000 mg | ORAL_TABLET | Freq: Two times a day (BID) | ORAL | Status: DC
  Filled 2023-12-04: qty 1

## 2023-12-04 MED ORDER — ACETAMINOPHEN 325 MG PO TABS
650.0000 mg | ORAL_TABLET | Freq: Four times a day (QID) | ORAL | Status: DC | PRN
  Administered 2023-12-04 (×2): 650 mg via ORAL
  Filled 2023-12-04 (×2): qty 2

## 2023-12-04 MED ORDER — TRAZODONE HCL 50 MG PO TABS: 50.0000 mg | ORAL_TABLET | Freq: Every evening | ORAL | Status: DC | PRN

## 2023-12-04 MED ORDER — MIRTAZAPINE 7.5 MG PO TABS
7.5000 mg | ORAL_TABLET | Freq: Every day | ORAL | Status: DC
  Administered 2023-12-04: 7.5 mg via ORAL
  Filled 2023-12-04: qty 1

## 2023-12-04 MED ORDER — ALUM & MAG HYDROXIDE-SIMETH 200-200-20 MG/5ML PO SUSP: 30.0000 mL | ORAL | Status: DC | PRN

## 2023-12-04 MED ORDER — OLANZAPINE 10 MG IM SOLR: 5.0000 mg | Freq: Three times a day (TID) | INTRAMUSCULAR | Status: DC | PRN

## 2023-12-04 MED ORDER — HALOPERIDOL LACTATE 5 MG/ML IJ SOLN: 10.0000 mg | Freq: Three times a day (TID) | INTRAMUSCULAR | Status: DC | PRN

## 2023-12-04 MED ORDER — DIPHENHYDRAMINE HCL 50 MG/ML IJ SOLN: 50.0000 mg | Freq: Three times a day (TID) | INTRAMUSCULAR | Status: DC | PRN

## 2023-12-04 MED ORDER — HYDROXYZINE HCL 25 MG PO TABS: 25.0000 mg | ORAL_TABLET | Freq: Three times a day (TID) | ORAL | Status: DC | PRN

## 2023-12-04 MED ORDER — NICOTINE 21 MG/24HR TD PT24
21.0000 mg | MEDICATED_PATCH | Freq: Every day | TRANSDERMAL | Status: DC
  Filled 2023-12-04: qty 1

## 2023-12-04 MED ORDER — HALOPERIDOL 5 MG PO TABS: 5.0000 mg | ORAL_TABLET | Freq: Three times a day (TID) | ORAL | Status: DC | PRN

## 2023-12-04 MED ORDER — DIPHENHYDRAMINE HCL 25 MG PO CAPS: 50.0000 mg | ORAL_CAPSULE | Freq: Three times a day (TID) | ORAL | Status: DC | PRN

## 2023-12-04 MED ORDER — MAGNESIUM HYDROXIDE 400 MG/5ML PO SUSP: 30.0000 mL | Freq: Every day | ORAL | Status: DC | PRN

## 2023-12-04 NOTE — Group Note (Signed)
 Date:  12/04/2023 Time:  9:43 PM  Group Topic/Focus:  Wrap-Up Group:   The focus of this group is to help patients review their daily goal of treatment and discuss progress on daily workbooks.    Participation Level:  Active  Participation Quality:  Appropriate and Attentive  Affect:  Excited  Cognitive:  Alert and Appropriate  Insight: Improving  Engagement in Group:  Engaged  Modes of Intervention:  Discussion and Education  Additional Comments:  Pt attended and participated in wrap up group this evening and rated their day a 10/10. Pt stated that they met their peers and had a good visit from their brother. Pt states that they do not need any assistance from their treatment team, because everything is good with them. Pt has no concerns to relay at this time.   Derrick Pham 12/04/2023, 9:43 PM

## 2023-12-04 NOTE — Progress Notes (Signed)
 Tour of Duty:  Derrick JINNY Angle, RN, 12/04/23, Tour of Duty: 0700-1900  SI/HI/AVH: Denies  Self-Reported   Mood: Negative  Anxiety: Denies, but observed Depression: Denies Irritability: Denies  Broset  Violence Prevention Guidelines *See Row Information*: Small Violence Risk interventions implemented   LBM  Last BM Date : 12/04/23   Pain: present, PRN and/or interventions refused  Patient Refusals (including Rx): Yes, including Rx refusal  Shift Summary: Patient observed to be calm but mildly anxious on unit. Patient able to make needs known. Patient observed to engage appropriately with staff and peers. Patient refusing medications as prescribed. This shift, no PRN medication requested or required. No observed or reported side effects to medication. No observed or reported agitation, aggression, or other acute emotional distress. No observed or reported physical abnormalities or concerns. Patient is requesting X-Ray of his back.   Last Vitals  Vitals Weight: 78.9 kg Temp: 98.2 F (36.8 C) Temp Source: Oral Pulse Rate: 70 Resp: 16 BP: 112/69 Patient Position: (not recorded)  Admission Type  Psych Admission Type (Psych Patients Only) Admission Status: Involuntary Date 72 hour document signed : (not recorded) Time 72 hour document signed : (not recorded) Provider Notified (First and Last Name) (see details for LINK to note): (not recorded)   Psychosocial Assessment  Psychosocial Assessment Patient Complaints: None Eye Contact: Fair Facial Expression: Anxious Affect: Anxious Speech: Logical/coherent Interaction: Assertive Motor Activity: Other (Comment) (WDL) Appearance/Hygiene: Unremarkable Behavior Characteristics: Unwilling to participate, Anxious Mood: Anxious, Pleasant   Aggressive Behavior  Targets: (not recorded)   Thought Process  Thought Process Coherency: Within Defined Limits Content: Within Defined Limits Delusions: None reported or  observed Perception: Within Defined Limits Hallucination: None reported or observed Judgment: Limited Confusion: None  Danger to Self/Others  Danger to Self Current suicidal ideation?: Denies Description of Suicide Plan: (not recorded) Self-Injurious Behavior: (not recorded) Agreement Not to Harm Self: (not recorded) Description of Agreement: (not recorded) Danger to Others: None reported or observed

## 2023-12-04 NOTE — Progress Notes (Signed)
 Patient ID: Derrick Pham, male   DOB: 26-Jan-1996, 26 y.o.   MRN: [id number]  0000 Pt ambulated to the facility with a steady gait. Pt is a 28 yo male, A/O x4 with mild confusion and Hx of MDD/SI/HI noted in the ED. When asked why Pt was admitted, Pt said, I was fustrated with my job and I told a co-worker about me hurting other co-workers then killing myself but I didn't mean it. I was just pissed with the job B/C they didn't want to help me with workers comp after my fall. Pt said he fell off a ladder around September 29, 2023. Meds whole. Food/Fluids offered. NKDA. Pt denies currently SI/HI; A/V/H. ADL's self. Skin assessed with no noted issues. Old scratches to LUE with a scab. Cont of B/B. UDS was negative. Pt mood: Anxious. Pt denied smoking but confirmed drinking occasionally. Pt adjusting to the unit and room. No noted distress. Pt in bed with equal rise and fall of  chest. Monitoring continues during 7p-7a shift.

## 2023-12-04 NOTE — Tx Team (Signed)
 Initial Treatment Plan 12/04/2023 1:36 AM Derrick Pham FMW:[id number]    PATIENT STRESSORS: Occupational concerns     PATIENT STRENGTHS: Ability for insight  Communication skills  Supportive family/friends  Work skills    PATIENT IDENTIFIED PROBLEMS: Low concentration, Anxiety, Not having enough time in the day. Express negative thoughts verbally that I did not mean to say. Not being assisted R/T injuries from a fall during work hrs.                      DISCHARGE CRITERIA:  Ability to meet basic life and health needs Adequate post-discharge living arrangements Medical problems require only outpatient monitoring  PRELIMINARY DISCHARGE PLAN: Participate in family therapy Return to previous living arrangement Return to previous work or school arrangements  PATIENT/FAMILY INVOLVEMENT: This treatment plan has been presented to and reviewed with the patient, Derrick Pham. The patient has been given the opportunity to ask questions and make suggestions.  Bronwyn ONEIDA Sharps, RN 12/04/2023, 1:36 AM

## 2023-12-04 NOTE — Progress Notes (Signed)
   12/04/23 0100  Psych Admission Type (Psych Patients Only)  Admission Status Involuntary  Psychosocial Assessment  Patient Complaints Anxiety;Decreased concentration;Depression;Nervousness;Substance abuse  Eye Contact Fair  Facial Expression Wide-eyed  Affect Appropriate to circumstance  Speech Rapid;Pressured  Interaction Assertive  Motor Activity Other (Comment) (WNL)  Appearance/Hygiene In scrubs  Behavior Characteristics Appropriate to situation;Cooperative  Mood Pleasant  Thought Process  Coherency WDL  Content WDL  Delusions None reported or observed  Perception WDL  Hallucination None reported or observed  Judgment Limited  Confusion Mild  Danger to Self  Current suicidal ideation? Passive (Denies currently)  Agreement Not to Harm Self Yes  Description of Agreement Notify Staff.  Danger to Others  Danger to Others None reported or observed

## 2023-12-04 NOTE — Plan of Care (Signed)
   Problem: Education: Goal: Emotional status will improve Outcome: Progressing Goal: Mental status will improve Outcome: Progressing

## 2023-12-04 NOTE — BHH Group Notes (Signed)
 Adult Psychoeducational Group Note  Date:  12/04/2023 Time:  9:15 AM  Group Topic/Focus:  Goals Group:   The focus of this group is to help patients establish daily goals to achieve during treatment and discuss how the patient can incorporate goal setting into their daily lives to aide in recovery. Orientation:   The focus of this group is to educate the patient on the purpose and policies of crisis stabilization and provide a format to answer questions about their admission.  The group details unit policies and expectations of patients while admitted.  Participation Level:  Active  Participation Quality:  Attentive  Affect:  Appropriate  Cognitive:  Alert  Insight: Appropriate  Engagement in Group:  Engaged  Modes of Intervention:  Discussion  Additional Comments:  Patient attended and participated in goals group.  Derrick Pham 12/04/2023, 9:15 AM

## 2023-12-04 NOTE — BHH Suicide Risk Assessment (Signed)
 [facility] Admission Suicide Risk Assessment   Nursing information obtained from:    Demographic factors:  Male, Adolescent or young adult, Access to firearms Current Mental Status:  Self-harm thoughts, Thoughts of violence towards others Loss Factors:  NA Historical Factors:  Impulsivity Risk Reduction Factors:  Employed, Positive social support  Total Time spent with patient: 1 hour Principal Problem: Adjustment disorder with mixed anxiety and depressed mood Diagnosis:  Principal Problem:   Adjustment disorder with mixed anxiety and depressed mood   For further information regarding subjective and objective, including MSE and diagnoses, please refer to this author's H and P  SUICIDE RISK:  The patient presented on IVC filled out by his boss reporting he made suicidal and homicidal statements. He additionally carries risk factors of male gender, prior history of substance use (in remission), impulsivity and poor frustration tolerance. However, the patient does have several mitigating factors, including no prior episodes of depression, no history of suicide attempts, denial of SI on all evaluations, corroboration from significant other that the patient is prone to impulsive statements when frustrated but has no history of actual harm to self/others. He is domiciled, employed, has good family relationships and significant other. He is future oriented. Static risk factors and impulsive statements indicating ongoing Low/moderate risk of harm to self. Out of an abundance of caution, as the writer of the IVC was unable to be contacted today, he will be monitored overnight for any additional concerns.   HOMICIDE RISK: Patient reportedly made homicidal remarks towards individuals at his work (said to a friend, about others). For this an IVC was written by the patient's boss. This is in the context of ongoing worker's comp conflict with the company and does increase risk of harm to others. However, the  patient is not currently presenting as overtly manic or psychotic. He has a history of impulsive statements when frustrated but no history of acting on these statements. He has consistently denied intent to harm anyone, including the people at work. Collateral from the patient's significant other indicates that he has no history of harming others and does not believe he would pose a risk to himself or anyone else. Currently deemed low/moderate risk of harm to others.  Out of an abundance of caution, as the writer of the IVC was unable to be contacted today, he will be monitored overnight for any additional concerns.   As noted in HPI, he will be monitored overnight and offered mirtazapine  to address sleep, mood and anxiety concerns. Will work on reaching out to Retail banker of IVC prior to discharge.   I certify that inpatient services furnished can reasonably be expected to improve the patient's condition.   Leita LOISE Arts, MD 12/04/2023, 12:31 PM

## 2023-12-04 NOTE — Group Note (Signed)
 LCSW Group Therapy Note   Group Date: 12/04/2023 Start Time: 1100 End Time: 1200   Participation:  patient was present  Type of Therapy:  Group Therapy  Topic:  Finding Balance: Using Wise Mind for Thoughtful Decisions  Objective:  To help participants understand and apply the concept of [place]elsie Mind to make balanced, thoughtful decisions by integrating emotion and logic.  Goals: Learn the differences between Emotional Mind, Reasonable Mind, and [employer]. Recognize personal signs of Emotional and Reasonable Mind. Practice using [employer] in real-life scenarios.  Therapeutic Modalities: Elements of Dialectical Behavior Therapy (DBT):  Mindfulness (noticing thoughts and emotions without judgment), Emotion Regulation (understanding and managing emotional responses), Distress Tolerance (coping with difficult situations without making them worse), Wise Mind (integrating emotion and reason for balanced decision-making) Elements of Cognitive Behavioral Therapy (CBT):  Identifying automatic thoughts, Challenging cognitive distortions, Using logic to reframe unhelpful thinking patterns  Summary:  This class focused on Wise Mind - DBT's concept of balancing Emotional Mind and Reasonable Mind. We identified when we're in each state and practiced using Wise Mind to respond thoughtfully in real-life situations. By combining emotion and logic, participants can improve decision-making, manage challenges, and enhance relationships.   Jessalynn Mccowan O Arnetia Bronk, LCSWA 12/04/2023  12:12 PM

## 2023-12-04 NOTE — Progress Notes (Signed)
   12/04/23 2015  Psych Admission Type (Psych Patients Only)  Admission Status Involuntary  Psychosocial Assessment  Patient Complaints Anxiety  Eye Contact Fair  Facial Expression Anxious;Wide-eyed  Affect Appropriate to circumstance  Speech Logical/coherent;Rapid  Interaction Assertive  Motor Activity Other (Comment) (WNL)  Appearance/Hygiene In scrubs  Behavior Characteristics Appropriate to situation;Cooperative  Mood Pleasant  Thought Process  Coherency WDL  Content WDL  Delusions None reported or observed  Perception WDL  Hallucination None reported or observed  Judgment Limited  Confusion None  Danger to Self  Current suicidal ideation?  (Denies)  Agreement Not to Harm Self Yes  Description of Agreement Notify Staff  Danger to Others  Danger to Others None reported or observed

## 2023-12-04 NOTE — H&P (Signed)
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 discharge Short Term Goals: Ability to verbalize feelings will improve  Anticipated discharge: tomorrow - out of an abundance of caution given statements made at work, will monitor overnight and evaluate response to mirtazapine .   I certify that inpatient services furnished can reasonably be expected to improve the patient's condition.    Leita LOISE Arts, MD 8/7/202512:11 PM

## 2023-12-04 NOTE — Group Note (Signed)
 Occupational Therapy Group Note  Group Topic:Coping Skills  Group Date: 12/04/2023 Start Time: 1430 End Time: 1500 Facilitators: Dot [place] MATSU, OT   Group Description: Group encouraged increased engagement and participation through discussion and activity focused on Coping Ahead. Patients were split up into teams and selected a card from a stack of positive coping strategies. Patients were instructed to act out/charade the coping skill for other peers to guess and receive points for their team. Discussion followed with a focus on identifying additional positive coping strategies and patients shared how they were going to cope ahead over the weekend while continuing hospitalization stay.  Therapeutic Goal(s): Identify positive vs negative coping strategies. Identify coping skills to be used during hospitalization vs coping skills outside of hospital/at home Increase participation in therapeutic group environment and promote engagement in treatment   Participation Level: Engaged   Participation Quality: Independent   Behavior: Appropriate   Speech/Thought Process: Relevant   Affect/Mood: Appropriate   Insight: Fair   Judgement: Fair      Modes of Intervention: Education  Patient Response to Interventions:  Attentive   Plan: Continue to engage patient in OT groups 2 - 3x/week.  12/04/2023  [place] MATSU Dot, OT Mikeal Winstanley, OT

## 2023-12-04 NOTE — Plan of Care (Signed)

## 2023-12-04 NOTE — BH Assessment (Signed)
(  Sleep Hours) - 3.75 (Any PRNs that were needed, meds refused, or side effects to meds)- Tylenol  650 mg PO C/O back, L ankle, L arm pain 7-10. (Any disturbances and when (visitation, over night)- None (Concerns raised by the patient)- Newly admitted 0000. (SI/HI/AVH)- Denies

## 2023-12-05 DIAGNOSIS — F4323 Adjustment disorder with mixed anxiety and depressed mood: Secondary | ICD-10-CM | POA: Diagnosis not present

## 2023-12-05 MED ORDER — MIRTAZAPINE 7.5 MG PO TABS: 7.5000 mg | ORAL_TABLET | Freq: Every day | ORAL | 0 refills | Status: AC

## 2023-12-05 NOTE — [facility] Suicide Risk Assessment (Signed)
 [facility] INPATIENT:  Family/Significant Other Suicide Prevention Education  Suicide Prevention Education:  Education Completed; Lang brother [phone],  (name of family member/significant other) has been identified by the patient as the family member/significant other with whom the patient will be residing, and identified as the person(s) who will aid the patient in the event of a mental health crisis (suicidal ideations/suicide attempt).  With written consent from the patient, the family member/significant other has been provided the following suicide prevention education, prior to the and/or following the discharge of the patient.  The suicide prevention education provided includes the following: Suicide risk factors Suicide prevention and interventions National Suicide Hotline telephone number [facility] assessment telephone number [facility] Emergency Assistance 911 [facility] and/or Residential Mobile Crisis Unit telephone number  Request made of family/significant other to: Remove weapons (e.g., guns, rifles, knives), all items previously/currently identified as safety concern.   Remove drugs/medications (over-the-counter, prescriptions, illicit drugs), all items previously/currently identified as a safety concern.  The family member/significant other verbalizes understanding of the suicide prevention education information provided.  The family member/significant other agrees to remove the items of safety concern listed above.  Derrick Pham 12/05/2023, 9:56 AM

## 2023-12-05 NOTE — BHH Counselor (Signed)
 Adult Comprehensive Assessment  Patient ID: QUAME SPRATLIN, male   DOB: Apr 22, 1996, 28 y.o.   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 Family Relationships:  no, I have a very good family Surveyor, quantity / Lack of resources (include bankruptcy):  yes, my hours have been cur due to my injuries Housing / Lack of housing:  no, I have a good place to live Physical health (include injuries & life threatening diseases):  yes, I have injuires from a fall Social relationships: none reported Substance abuse: none reported Bereavement / Loss: none reported  Living/Environment/Situation:  Living Arrangements: Spouse/significant other, Parent Living conditions (as described by patient or guardian):  I have my own room Who else lives in the home?:  I live with my girlfriend and her parents How long has patient lived in current situation?: 3 yrs What is atmosphere in current home: Comfortable, Paramedic, Supportive  Family History:  Marital status: Single Are you sexually active?: Yes What is your sexual orientation?:  I am straight Does patient have children?: No  Childhood History:  By whom was/is the patient raised?: Mother, Other (Comment) (step father) Additional childhood history information: none reported Description of patient's relationship with caregiver when they were a child:  it was good Patient's description of current relationship with people who raised him/her:  still good How were you disciplined when you got in  trouble as a child/adolescent?:  I was grounded Does patient have siblings?: Yes Number of Siblings: 7 Description of patient's current relationship with siblings:  we have a very good relationship Did patient suffer any verbal/emotional/physical/sexual abuse as a child?: No Did patient suffer from severe childhood neglect?: No Has patient ever been sexually abused/assaulted/raped as an adolescent or adult?: No Was the patient ever a victim of a crime or a disaster?: No Witnessed domestic violence?: No Has patient been affected by domestic violence as an adult?: No  Education:  Highest grade of school patient has completed: 12th and 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 Currently a Consulting civil engineer?: No Learning disability?: Yes What learning problems does patient have?:  I have trouble reading  Employment/Work Situation:   Employment Situation: Employed 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 Long has Patient Been Employed?: 3 yrs Are You Satisfied With Your Job?: Yes Do You Work More Than One Job?: No Work Stressors:  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, 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 Job has Been Impacted by Current Illness: No What is the Longest Time Patient has Held a Job?: 3 yrs 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 Resources:   Financial resources: Income from employment Does patient have a representative payee or guardian?: No  Alcohol/Substance Abuse:   What has been your use of drugs/alcohol within the last 12 months?:  I drank a little bit on my birthday, which was yesterday If attempted suicide, did drugs/alcohol play a role in this?: No Alcohol/Substance Abuse Treatment Hx: Denies past history If yes, describe treatment: none reported Has alcohol/substance abuse ever  caused legal problems?: No  Social Support System:   Patient's Community Support System: Fair Describe Community Support System:  my family Type of  faith/religion:  I am a Curator  Leisure/Recreation:   Do You Have Hobbies?: Yes Leisure and Hobbies:  being with my family  Strengths/Needs:   What is the patient's perception of their strengths?:  I am loyal Patient states these barriers may affect/interfere with their treatment:  I can't think anything right now, I think I may need to quit my job Patient states these barriers may affect their return to the community: none reported  Discharge Plan:   Currently receiving community mental health services: No Patient states concerns and preferences for aftercare planning are:  I would like to have someone to talk to Patient states they will know when they are safe and ready for discharge when:  I am ready to go now, this was a mistake Does patient have access to transportation?: Yes Does patient have financial barriers related to discharge medications?: No Patient description of barriers related to discharge medications: none reported Will patient be returning to same living situation after discharge?: Yes  Summary/Recommendations:   Summary and Recommendations (to be completed by the evaluator): Orby is a 28 y.o. male involuntarily admitted to [facility] after presenting to [facility] due to suicidal and homicidal thoughts. Pt reported stressors as ongoing issues with his job, physical ailments relating to a workplace injury and learning disability. Pt denies SI/HI/AVH. Pt reported drinking beer a couple of days ago for it was his birthday. Pt currently does not have outpatient providers and requesting therapy services upon discharge. Patient will benefit from crisis stabilization, medication evaluation, group therapy and psychoeducation, in addition to case management for discharge planning. At discharge it is recommended that Patient adhere to the established discharge plan and continue in treatment.  Jorell Agne R. 12/05/2023

## 2023-12-05 NOTE — BHH Suicide Risk Assessment (Signed)
 [facility] Discharge Suicide and Homicide Risk Assessment   Principal Problem: Adjustment disorder with mixed anxiety and depressed mood Discharge Diagnoses: Principal Problem:   Adjustment disorder with mixed anxiety and depressed mood   Total Time spent with patient: 1 hour - see Discharge summary for further details      Suicide Risk:  The patient presented via IVC due to acute risk factor of making suicidal statements to a coworker (statements about killing himself as well as others at his work). He additionally carries risk factors of male gender, young age, impulsivity. However, since admission the patient has denied subjective depression and has not presented as observably depressed on interview - not dysphoric or tearful. He has consistently denied SI. He has an adjustment disorder more related to workplace injury, ongoing pain and reduced mobility that has impacted his mood and increased anxiety. However, he has no prior psychiatric admissions, no prior suicide attempts or self-harming behaviors. He has no significant substance use concerns. He is domiciled, employed and in a relationship. Collateral from patient's girlfriend indicates a history of making impulsive statements out of frustration but no history of acting on statements. At this time the patient's short term risk of self harm is deemed low.  Homicide risk: The patient presented via IVC due to acute risk factor of making suicidal statements to a coworker (statements about killing himself as well as others at his work). This is in the context of grievance with the workplace as they have been denying him workman's comp. He does not otherwise carry risk factors for harm to others. He has no criminal history, no history of violence, has consistently denied HI. As noted above, he does have a history of making impulsive statements out of frustration but has no history of ever acting on violent statements. He does not have significant substance  use concerns. He is not currently presenting with any symptoms of mania or psychosis. At this time, the patient's short term risk of harm to others is deemed low  Overall: Although the patient did make statements about wanting to harm himself and others at work, these statements were made impulsively in a moment of frustration. He has no ongoing depression, mania or psychosis and no history of harming himself or others. He has expressed remorse and regret for these statements, has consistently denied SI and HI, and has been well-engaged in safety planning. To further mitigate risk we have insured his firearm is locked in a safe and not available. He will have follow up with psychiatry to continue mirtazapine , which he took without issue last evening. At this time the patient is considered low risk of harm to self or others. He no longer meets criteria for involuntary admission. The most appropriate and least restrictive environment is outpatient follow up:    Follow-up Information     [facility] Follow up on 12/12/2023.   Why: You have a hospital follow up appointment for therapy and medication management services on  8/15 at 3:30 pm.  This will be a Virtual telehealth appointment. Contact information: [address]  [address] [place] [address] [phone]                  Leita LOISE Arts, MD 12/05/2023, 9:13 AM

## 2023-12-05 NOTE — Plan of Care (Signed)

## 2023-12-05 NOTE — BH Assessment (Signed)
(  Sleep Hours) - 6.25 (Any PRNs that were needed, meds refused, or side effects to meds)- C/O back pain 7-10 pain scale. Tylenol  650 mg given PRN PO. (Any disturbances and when (visitation, over night)- None (Concerns raised by the patient)- Requesting to sign consent for Voluntary/and resend IVC (SI/HI/AVH)- Denies

## 2023-12-05 NOTE — Progress Notes (Signed)
 Patient verbalizes readiness for discharge. All patient belongings returned to patient. Discharge instructions read and discussed with patient (appointments, medications, resources). Patient expressed gratitude for care provided. Patient discharged to lobby at 1125 where his ride was waiting.

## 2023-12-05 NOTE — Progress Notes (Signed)
 Adult Psychoeducational Group Note  Date:  12/05/2023 Time:  9:05 AM  Group Topic/Focus:  Goals Group:   The focus of this group is to help patients establish daily goals to achieve during treatment and discuss how the patient can incorporate goal setting into their daily lives to aide in recovery.  Participation Level:  Active  Participation Quality:  Appropriate  Affect:  Appropriate  Cognitive:  Appropriate  Insight: Appropriate  Engagement in Group:  Engaged  Modes of Intervention:  Discussion  Additional Comments:  Pt stated he is feeling good.  Pt goal for the day is to be happy.  Daine Pillar D 12/05/2023, 9:05 AM

## 2023-12-05 NOTE — Discharge Summary (Signed)
 Physician Discharge Summary Note  Patient:  Derrick Pham is an 28 y.o., male MRN:  [id number] DOB:  Mar 05, 1996 Patient phone:  [phone] (home)  Patient address:   [address] [place] [address],  Total Time spent with patient: 1 hour  Date of Admission:  12/04/2023 Date of Discharge: 12/05/23  Reason for Admission:  IVC for suicidal and homicidal remarks   Principal Problem: Adjustment disorder with mixed anxiety and depressed mood Discharge Diagnoses: Principal Problem:   Adjustment disorder with mixed anxiety and depressed mood  Identifying Information and psychiatric history The patient is a 28 y.o. male (domiciled with girlfriend and her family, employed as an Personnel officer) with a medical history of asthma and a psychiatric history of ADHD and adjustment disorder with mixed anxious and depressed mood. On this admission the patient presented to behavioral health urgent care on 12/03/23 BIB police under IVC petition. IVC was written by the patient's employer who documented that the patient had made suicidal and homicidal remarks.    Prior to this hospitalization, the patient had no hospitalizations, no history of mood or psychotic disorders, no medications tried and did not follow with a psychiatrist. He has no history of suicide attempts or self-harming behaviors and no significant substance use history. He carried a historical ADHD diagnosis with symptoms from childhood including behavioral issues in regard to being easily distracted, never sitting still, being loud and impulsive.   Hospital Course: In urgent care, the patient did acknowledge he had told several people recently that he was having suicidal thoughts, including physician and brother. Endorsed suicidal thought present since falling approximately 2 months ago with fracture of elbow and with ongoing pain and neurological concerns that have no explanation on imaging. During interview noted to be inattentive and anxious,  requiring frequent redirection. He denied SI, HI and AVH, however initially appeared depressed and there was some concern for paranoia. 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 Labs demonstrated CBC and CMP WNL, A1c of 5.4, BAL and UDS negative.  TSH WNL and lipid panel did demonstrate elevated triglycerides and VLDL, otherwise parameters WNL. Patient transferred to inpatient [facility] evening of 12/03/23 with PRN Medications placed per behavioral health protocol and placed on olanzapine  5 mg BID, however patient refused medication.   Upon arrival to inpatient psychiatry, the patient continued to deny SI, HI and AVH. No behavioral concerns or evidence of psychosis was documented overnight. On intake he edorsed making suicidal/homicidal comments out of frustration with job not helping him with workers comp but that he did not mean it.   The following morning the patient was evaluated by this author on the floor. He was was linear and organized in thought process, engaged in interview and and demonstrated appropriate mannerisms and eye contact. He was easily distractible and occasionally had increased rate in speech, but overall was redirectable and able to adhere appropriately to interview. No objective signs of psychosis were observed. He has no prior history of psychotic symptoms per chart review, patient and collateral collected from significant other, who lives with the patient and confirmed no observable changes in behavior at home, no loss of executive functioning and at no time had been talking to self or acting erratically. Although he appeared at urgent care to be paranoid, concerning for psychosis, there is an ongoing legal case with the  individual who wrote the IVC based on a real workplace injury for which he is still undergoing workup. Overall, low  concern for any active psychosis. Distractibility was more consistent with historical ADHD diagnosis  as documented in HPI. Similarly, prior to his accident he denied any significant depressive symptoms.  He has never had impairments in sleep, appetite, executive functioning due to a low mood state.  More recently, the patient has been down and sad about the state of his physical health, has endorsed high anxiety about not being able to work as he is used to working, and per collateral has been very fixated on his physical health. Symptoms were deemed more consistent with an adjustment disorder with mixed depressed and anxious mood, with illness anxiety disorder to be be ruled out on an outpatient basis. Given low concern for psychosis, olanzapine  was discontinued and instead he was offered mirtazapine  7.5 mg at bedtime to address intermittent low mood, anxiety and sleep related to adjustment disorder.   Behavior on unit: Throughout the patient's time on inpatient psychiatry he consistently denied SI, HI and AVH. Was cooperative with care, took mirtazapine  as prescribed, and did was not seen to be responding to internal stimuli or otherwise demonstrating psychotic symptoms. He did not require PRNs for agitation.   Interview Today: The patient was interviewed today and reports that he is good. States that he took the mirtazapine  last night; did notice much of a difference but it did make him a bit sleepy. Notes he woke up and saw his roommate sititng up in bed, which was jarring, and that is the reason he did not sleep as well last night. Otherwise denies feeling suicidal, denies feelings of wanting to harm anyone else. Continues to have high anxiety around his health and what being involuntarily committed might mean for his life. Worried about his job and the relationship with his boss, who IVC'd him. Overall he is leaning towards quitting his job, even though this means he will miss out on some of the money he would be entitled to for his worker's comp. He remains motivated to go for therapy after this,  follow up with a psychiatrist. Denying Si, HI and AVH. Not additional concerns voiced today; looking forward to going home   Discharge MSE: Appearance: brown skinned male, tall, thin, appropriately groomed in jeans and striped t-shirt Eye contact: good  Attitude towards examiner cooperative, engaged  Psychomotor: no agitation or retardation Speech: at times slightly elevated in rate, not pressured, normal in prosody  Mood: good Affect: congruent, neutral-euthymic, appropriately reactive  Thought content: denying SI and HI, no delusions expressed  Thought Process: linear and organized, goal-directed  Perception: denying AVH, not RTIS  Insight: good - full understanding for reason for hospitalization, recommendations and diagnoses  Judgement: fair to good   Orientation: 3 Attention/Concentration: good - attends to interview  Memory/Cognition: grossly intact recent and remote memory     Collateral: Clotilda Berlin [phone])  -attempted to call at approximately 8:00 am, no response   Marsa (girlfriend) - [phone]  -Called again this morning for safety planning. Meo reports no concerns with Benford coming home today and that brother, Lang [phone] can come pick him up. We discussed gun safety and Meo states the gun is in a safe and she will keep it locked and away from him so he does not have access.    Past Medical History:  Past Medical History:  Diagnosis Date   Chalazion of right lower eyelid 03/2015   History of asthma    as a child    Past Surgical History:  Procedure Laterality Date  CHALAZION EXCISION Right 04/28/2015   Procedure: EXCISION CHALAZION RIGHT LOWER LID;  Surgeon: Glendale Blanch, MD;  Location: [facility];  Service: Ophthalmology;  Laterality: Right;   INGUINAL HERNIA REPAIR     Family History: History reviewed. No pertinent family history. Family Psychiatric  History: denies  Social History:  Social History   Substance and Sexual  Activity  Alcohol Use Yes   Comment: weekly     Social History   Substance and Sexual Activity  Drug Use No    Social History   Socioeconomic History   Marital status: Single    Spouse name: Not on file   Number of children: Not on file   Years of education: Not on file   Highest education level: Not on file  Occupational History   Not on file  Tobacco Use   Smoking status: Never   Smokeless tobacco: Never  Vaping Use   Vaping status: Never Used  Substance and Sexual Activity   Alcohol use: Yes    Comment: weekly   Drug use: No   Sexual activity: Not on file  Other Topics Concern   Not on file  Social History Narrative   Pt. lives with stepfather, but he is not legal guardian.  Mother is out-of-town and will not be here DOS.  Advised her that she will need to be available by phone to do telephone consent for surgery DOS; she states that she will be available [phone]).  Will need to use [employer] for phone consent, mother does not speak [place] [phone])   Social Drivers of Health   Financial Resource Strain: Not on file  Food Insecurity: No Food Insecurity (12/04/2023)   Hunger Vital Sign    Worried About Running Out of Food in the Last Year: Never true    Ran Out of Food in the Last Year: Never true  Transportation Needs: No Transportation Needs (12/04/2023)   PRAPARE - Administrator, Civil Service (Medical): No    Lack of Transportation (Non-Medical): No  Physical Activity: Not on file  Stress: Not on file  Social Connections: Not on file   Objective:  Physical Exam Constitutional:      Appearance: Normal appearance.  HENT:     Head: Normocephalic and atraumatic.  Eyes:     Extraocular Movements: Extraocular movements intact.     Pupils: Pupils are equal, round, and reactive to light.  Abdominal:     General: There is no distension.  Neurological:     General: No focal deficit present.     Mental Status: He is alert and  oriented to person, place, and time.    Review of Systems  All other systems reviewed and are negative.  Blood pressure 130/81, pulse 63, temperature 98 F (36.7 C), temperature source Oral, resp. rate 16, height 5' 11 (1.803 m), weight 78.9 kg, SpO2 100%. Body mass index is 24.27 kg/m.   Social History   Tobacco Use  Smoking Status Never  Smokeless Tobacco Never   Tobacco Cessation:  N/A, patient does not currently use tobacco products   Blood Alcohol level:  Lab Results  Component Value Date   [facility] <15 12/03/2023    Metabolic Disorder Labs:  Lab Results  Component Value Date   HGBA1C 5.4 12/03/2023   MPG 108 12/03/2023   No results found for: PROLACTIN Lab Results  Component Value Date   CHOL 159 12/03/2023   TRIG 239 (H) 12/03/2023   HDL 46  12/03/2023   CHOLHDL 3.5 12/03/2023   VLDL 48 (H) 12/03/2023   LDLCALC 65 12/03/2023    See Psychiatric Specialty Exam and Suicide Risk Assessment completed by Attending Physician prior to discharge.  Discharge destination:  home with girlfriend, Marsa  Is patient on multiple antipsychotic therapies at discharge:  No    Medications: -Mirtazapine  7.5 mg at bedtime for mood and anxiety - print provided   Discharge Instructions     Activity as tolerated - No restrictions   Complete by: As directed         Follow-up Information     [facility] Follow up on 12/12/2023.   Why: You have a hospital follow up appointment for therapy and medication management services on  8/15 at 3:30 pm.  This will be a Virtual telehealth appointment. Contact information: [address]  [address] [place] [address] [phone]                 Comments:   The patient was discharged in stable and improved condition, free from suicidal thoughts, with no observable signs of depression, mania or psychosis. As above, recommended continuing on mirtazapine  to address mood and anxiety symptoms related to adjustment disorder.  Additionally recommended another week off work and patient is planning more than likely to exit this job given that his boss was the one that filled out the IVC form. Gun has been locked away by girlfriend. See risk assessment for more details   Signed: Leita LOISE Arts, MD 12/05/2023, 10:39 AM

## 2023-12-24 ENCOUNTER — Ambulatory Visit (INDEPENDENT_AMBULATORY_CARE_PROVIDER_SITE_OTHER): Admitting: Mental Health

## 2023-12-24 DIAGNOSIS — F4322 Adjustment disorder with anxiety: Secondary | ICD-10-CM | POA: Diagnosis not present

## 2023-12-24 NOTE — Progress Notes (Signed)
 [facility] Counsel[facility] Initial Adult Exam  Name: Derrick Pham Date: 12/24/2023 MRN: [id number] DOB: 12/10/1995 PCP: Terril Gee, PA-C  Time spent: 48 minutes  Reason for Visit /Presenting Problem: Patient presents for assessment. He stated that he has worked at his most recent job for 3 years, 7 months.  He is currently unemployed and looking for work.  He went out of work 2 weeks prior to his going psychiatric inpatient earlier this month in August. He stated the Workmen's Compensation was not approved for these 2 weeks out. He stated he refused work's request for a drug screening, however, when he went to the hospital a few days later, results indicated THC only.   His injuries occurred on 09/29/23, a radial fracture on his left elbow and an ankle fracture due to a fall at work. He stated he his injuries and associated pain presented over the next few days. He was written out of work for 1 month in July by his doctor. He stated he got an attorney due to his paying out of pocket as he was having to pay for his injuries.  He stated on 12/03/2023 he continued to meet with his doctor for treatment due to ongoing back pain.  Later that day went to work to continue to try to work hours since his return to work on 12/02/2023 to pick up some supplies. He stated police come to his work location due to his boss taking out an IVC. He stated his boss reported he was suicidal / homicidal, hearing voices and taking drugs.  He stated when he was inpatient he maintained that he was not suicidal, nor homicidal, denied hearing voices but felt some anxiety due being IVC and on an inpatient unit. He stated he was inpatient for 3 days, discharged 12/05/2023.  He reports also along with these many life adjustments, his girlfriend broke up with him 2 days ago.  He indicated worrying about finding a job due to these recent events, specifically his being IVC and admitted to the hospital.  He expressed wanting to potentially see a  doctor about trying to manage his symptoms associated with ADHD, problems with focus and attention. He reports a diagnostic history of ADHD in childhood, adjustment disorder with anxiety and depressed mood recently while inpatient. Recommended he return to therapy in 2 weeks and engage in a psychiatric evaluation.  Mental Status Exam:    Appearance:    Casual     Behavior:   Appropriate  Motor:   WNL  Speech/Language:    Clear and Coherent  Affect:   Full range   Mood:   Anxious, pleasant  Thought process:   Logical, linear, goal directed  Thought content:     WNL  Sensory/Perceptual disturbances:     none  Orientation:   x4  Attention:   Good  Concentration:   Good  Memory:   Intact  Fund of knowledge:    Consistent with age and development  Insight:     Good  Judgment:    Good  Impulse Control:   Good     Reported Symptoms:  problems with sustained focus/concentration, hyperactivity (excessively talkative), anxiety   Risk Assessment: Danger to Self:  No Self-injurious Behavior: No Danger to Others: No Duty to Warn:no Physical Aggression / Violence:No  Access to Firearms a concern: No  Gang Involvement:No  Patient / guardian was educated about steps to take if suicide or homicide risk level increases between visits: yes While future psychiatric events  cannot be accurately predicted, the patient does not currently require acute inpatient psychiatric care and does not currently meet [place]  involuntary commitment criteria.   Substance Abuse History: Current substance abuse: THC  Past Psychiatric History:   Previous psychological history is significant for ADHD and depression Outpatient Providers:  History of Psych Hospitalization: [facility] Psychological Testing: none  Medical History/Surgical History: Past Medical History:  Diagnosis Date   Chalazion of right lower eyelid 03/2015   History of asthma    as a child    Past Surgical  History:  Procedure Laterality Date   CHALAZION EXCISION Right 04/28/2015   Procedure: EXCISION CHALAZION RIGHT LOWER LID;  Surgeon: Glendale Blanch, MD;  Location: [facility];  Service: Ophthalmology;  Laterality: Right;   INGUINAL HERNIA REPAIR      Medications: Current Outpatient Medications  Medication Sig Dispense Refill   mirtazapine  (REMERON ) 7.5 MG tablet Take 1 tablet (7.5 mg total) by mouth at bedtime. 30 tablet 0   naproxen  (NAPROSYN ) 500 MG tablet Take 1 tablet (500 mg total) by mouth 2 (two) times daily with a meal. 30 tablet 0   No current facility-administered medications for this visit.    No Known Allergies  Diagnoses:    ICD-10-CM   1. Adjustment disorder with anxious mood  F43.22       Plan of Care: Return to therapy in 2 weeks   Lonni Fischer, [facility]

## 2024-01-23 ENCOUNTER — Ambulatory Visit: Admitting: Mental Health

## 2024-01-23 ENCOUNTER — Ambulatory Visit: Admitting: Behavioral Health
